# Patient Record
Sex: Male | Born: 1965 | Race: Black or African American | Hispanic: No | Smoking: Current every day smoker
Health system: Southern US, Community
[De-identification: ages and names within clinical notes are randomized; demographics above are authoritative.]

## PROBLEM LIST (undated history)

## (undated) DIAGNOSIS — I10 Essential (primary) hypertension: Secondary | ICD-10-CM

## (undated) DIAGNOSIS — C61 Malignant neoplasm of prostate: Secondary | ICD-10-CM

## (undated) DIAGNOSIS — M1990 Unspecified osteoarthritis, unspecified site: Secondary | ICD-10-CM

## (undated) DIAGNOSIS — M5430 Sciatica, unspecified side: Secondary | ICD-10-CM

## (undated) DIAGNOSIS — K219 Gastro-esophageal reflux disease without esophagitis: Secondary | ICD-10-CM

## (undated) DIAGNOSIS — M109 Gout, unspecified: Secondary | ICD-10-CM

## (undated) DIAGNOSIS — E7800 Pure hypercholesterolemia, unspecified: Secondary | ICD-10-CM

## (undated) DIAGNOSIS — E669 Obesity, unspecified: Secondary | ICD-10-CM

## (undated) DIAGNOSIS — Z87898 Personal history of other specified conditions: Secondary | ICD-10-CM

## (undated) DIAGNOSIS — K635 Polyp of colon: Secondary | ICD-10-CM

## (undated) HISTORY — DX: Sciatica, unspecified side: M54.30

## (undated) HISTORY — DX: Polyp of colon: K63.5

## (undated) HISTORY — DX: Gout, unspecified: M10.9

## (undated) HISTORY — DX: Unspecified osteoarthritis, unspecified site: M19.90

## (undated) HISTORY — DX: Obesity, unspecified: E66.9

## (undated) HISTORY — PX: PROSTATE BIOPSY: SHX241

## (undated) HISTORY — DX: Personal history of other specified conditions: Z87.898

---

## 2001-05-25 ENCOUNTER — Emergency Department (HOSPITAL_COMMUNITY): Admission: EM | Admit: 2001-05-25 | Discharge: 2001-05-25 | Payer: Self-pay | Admitting: Emergency Medicine

## 2006-04-17 ENCOUNTER — Emergency Department (HOSPITAL_COMMUNITY): Admission: EM | Admit: 2006-04-17 | Discharge: 2006-04-17 | Payer: Self-pay | Admitting: Emergency Medicine

## 2014-10-19 ENCOUNTER — Other Ambulatory Visit (HOSPITAL_COMMUNITY): Payer: Self-pay | Admitting: Physician Assistant

## 2014-10-19 DIAGNOSIS — F17219 Nicotine dependence, cigarettes, with unspecified nicotine-induced disorders: Secondary | ICD-10-CM

## 2014-10-19 DIAGNOSIS — Z0000 Encounter for general adult medical examination without abnormal findings: Secondary | ICD-10-CM

## 2014-12-20 ENCOUNTER — Other Ambulatory Visit: Payer: Self-pay | Admitting: Urology

## 2014-12-20 ENCOUNTER — Ambulatory Visit (INDEPENDENT_AMBULATORY_CARE_PROVIDER_SITE_OTHER): Payer: 59 | Admitting: Urology

## 2014-12-20 DIAGNOSIS — N401 Enlarged prostate with lower urinary tract symptoms: Secondary | ICD-10-CM

## 2014-12-20 DIAGNOSIS — R972 Elevated prostate specific antigen [PSA]: Secondary | ICD-10-CM | POA: Diagnosis not present

## 2014-12-20 DIAGNOSIS — R351 Nocturia: Secondary | ICD-10-CM

## 2014-12-25 ENCOUNTER — Other Ambulatory Visit: Payer: Self-pay | Admitting: Urology

## 2014-12-25 DIAGNOSIS — R972 Elevated prostate specific antigen [PSA]: Secondary | ICD-10-CM

## 2015-01-10 ENCOUNTER — Ambulatory Visit (HOSPITAL_COMMUNITY)
Admission: RE | Admit: 2015-01-10 | Discharge: 2015-01-10 | Disposition: A | Discharge status: Home / Self Care | Payer: 59 | Source: Ambulatory Visit | Attending: Urology | Admitting: Urology

## 2015-01-10 DIAGNOSIS — R972 Elevated prostate specific antigen [PSA]: Secondary | ICD-10-CM | POA: Diagnosis present

## 2015-01-10 DIAGNOSIS — C61 Malignant neoplasm of prostate: Secondary | ICD-10-CM | POA: Insufficient documentation

## 2015-01-10 MED ORDER — LIDOCAINE HCL (PF) 2 % IJ SOLN
INTRAMUSCULAR | Status: AC
  Administered 2015-01-10: 10 mL
  Filled 2015-01-10: qty 10

## 2015-01-10 MED ORDER — LIDOCAINE HCL (PF) 2 % IJ SOLN
10.0000 mL | Freq: Once | INTRAMUSCULAR | Status: AC
Start: 2015-01-10 — End: 2015-01-10
  Administered 2015-01-10: 10 mL

## 2015-01-10 MED ORDER — GENTAMICIN SULFATE 40 MG/ML IJ SOLN
80.0000 mg | Freq: Once | INTRAMUSCULAR | Status: AC
  Administered 2015-01-10: 80 mg via INTRAMUSCULAR

## 2015-01-10 MED ORDER — GENTAMICIN SULFATE 40 MG/ML IJ SOLN
INTRAMUSCULAR | Status: AC
  Administered 2015-01-10: 80 mg via INTRAMUSCULAR
  Filled 2015-01-10: qty 2

## 2015-01-10 NOTE — Discharge Instructions (Signed)
Transrectal Ultrasound-Guided Biopsy °A transrectal ultrasound-guided biopsy is a procedure to take samples of tissue from your prostate. Ultrasound images are used to guide the procedure. It is usually done to check the prostate gland for cancer. °BEFORE THE PROCEDURE °· Do not eat or drink after midnight on the night before your procedure. °· Take medicines as your doctor tells you. °· Your doctor may have you stop taking some medicines 5-7 days before the procedure. °· You will be given an enema before your procedure. During an enema, a liquid is put into your butt (rectum) to clear out waste. °· You may have lab tests the day of your procedure. °· Make plans to have someone drive you home. °PROCEDURE °· You will be given medicine to help you relax before the procedure. An IV tube will be put into one of your veins. It will be used to give fluids and medicine. °· You will be given medicine to reduce the risk of infection (antibiotic). °· You will be placed on your side. °· A probe with gel will be put in your butt. This is used to take pictures of your prostate and the area around it. °· A medicine to numb the area is put into your prostate. °· A biopsy needle is then inserted and guided to your prostate. °· Samples of prostate tissue are taken. The needle is removed. °· The samples are sent to a lab to be checked. Results are usually back in 2-3 days. °AFTER THE PROCEDURE °· You will be taken to a room where you will be watched until you are doing okay. °· You may have some pain in the area around your butt. You will be given medicines for this. °· You may be able to go home the same day. Sometimes, an overnight stay in the hospital is needed. °  °This information is not intended to replace advice given to you by your health care provider. Make sure you discuss any questions you have with your health care provider. °  °Document Released: 02/12/2009 Document Revised: 03/01/2013 Document Reviewed:  10/13/2012 °Elsevier Interactive Patient Education ©2016 Elsevier Inc. ° °

## 2015-01-10 NOTE — Sedation Documentation (Signed)
Patent encouraged to follow up with PCP for blood pressure per Dr. Alyson Ingles. Pt refuses to go to the ED for blood pressure states he will see PCP.

## 2015-01-31 ENCOUNTER — Ambulatory Visit (INDEPENDENT_AMBULATORY_CARE_PROVIDER_SITE_OTHER): Payer: 59 | Admitting: Urology

## 2015-01-31 ENCOUNTER — Ambulatory Visit: Payer: 59 | Admitting: Urology

## 2015-01-31 DIAGNOSIS — C61 Malignant neoplasm of prostate: Secondary | ICD-10-CM

## 2015-01-31 DIAGNOSIS — R972 Elevated prostate specific antigen [PSA]: Secondary | ICD-10-CM | POA: Diagnosis not present

## 2015-05-02 ENCOUNTER — Ambulatory Visit: Payer: 59 | Admitting: Urology

## 2015-05-16 ENCOUNTER — Ambulatory Visit (INDEPENDENT_AMBULATORY_CARE_PROVIDER_SITE_OTHER): Payer: 59 | Admitting: Urology

## 2015-05-16 DIAGNOSIS — R351 Nocturia: Secondary | ICD-10-CM

## 2015-05-16 DIAGNOSIS — C61 Malignant neoplasm of prostate: Secondary | ICD-10-CM

## 2015-08-15 ENCOUNTER — Ambulatory Visit: Payer: 59 | Admitting: Urology

## 2015-12-26 ENCOUNTER — Ambulatory Visit (HOSPITAL_COMMUNITY)
Admission: RE | Admit: 2015-12-26 | Discharge: 2015-12-26 | Disposition: A | Discharge status: Home / Self Care | Payer: 59 | Source: Ambulatory Visit | Attending: Registered Nurse | Admitting: Registered Nurse

## 2015-12-26 ENCOUNTER — Other Ambulatory Visit (HOSPITAL_COMMUNITY): Payer: Self-pay | Admitting: Registered Nurse

## 2015-12-26 DIAGNOSIS — M5441 Lumbago with sciatica, right side: Secondary | ICD-10-CM

## 2016-02-06 ENCOUNTER — Ambulatory Visit: Payer: 59 | Admitting: Urology

## 2016-03-04 ENCOUNTER — Ambulatory Visit (INDEPENDENT_AMBULATORY_CARE_PROVIDER_SITE_OTHER): Payer: 59 | Admitting: Urology

## 2016-03-04 DIAGNOSIS — R351 Nocturia: Secondary | ICD-10-CM | POA: Diagnosis not present

## 2016-03-04 DIAGNOSIS — C61 Malignant neoplasm of prostate: Secondary | ICD-10-CM | POA: Diagnosis not present

## 2016-03-05 ENCOUNTER — Other Ambulatory Visit: Payer: Self-pay | Admitting: Urology

## 2016-03-05 DIAGNOSIS — C61 Malignant neoplasm of prostate: Secondary | ICD-10-CM

## 2016-03-12 ENCOUNTER — Ambulatory Visit (HOSPITAL_COMMUNITY): Payer: 59

## 2016-04-16 ENCOUNTER — Encounter: Payer: Self-pay | Admitting: Radiation Oncology

## 2016-04-28 ENCOUNTER — Ambulatory Visit: Payer: 59

## 2016-04-28 ENCOUNTER — Ambulatory Visit: Payer: 59 | Admitting: Radiation Oncology

## 2016-04-28 NOTE — Progress Notes (Signed)
GU Location of Tumor / Histology: prostatic adenocarcinoma  If Prostate Cancer, Gleason Score is (3+3=6) and PSA is (5.7-12/26/17). Prostate volume: 30 gram  Christopher Stanton presented signs/symptoms of: Erection issues, Nocturia  Biopsies of prostate (if applicable) revealed:    Past/Anticipated interventions by urology, if any: 2016 biopsy, surveillance, referral to Dr. Tammi Klippel  Past/Anticipated interventions by medical oncology, if any: no  Weight changes, if any: no  Bowel/Bladder complaints, if any: IPSS 7. Denies dysuria, hematuria, or leakage   Nausea/Vomiting, if any: no  Pain issues, if any:  no  SAFETY ISSUES:  Prior radiation? no  Pacemaker/ICD? no  Possible current pregnancy? no  Is the patient on methotrexate? no  Current Complaints / other details: 51 year old male. Single. Lives in [place].  Active Surveillance since 02/08/15.  HTN, Esophageal reflux, gout, arthritis, hypercholesterolemia.  Caffeine, tobacco, and alcohol use. Scheduled for a biopsy at [facility] on 05/14/16.

## 2016-04-30 ENCOUNTER — Other Ambulatory Visit (HOSPITAL_COMMUNITY): Payer: 59

## 2016-05-05 ENCOUNTER — Encounter: Payer: Self-pay | Admitting: Radiation Oncology

## 2016-05-05 ENCOUNTER — Encounter: Payer: Self-pay | Admitting: Medical Oncology

## 2016-05-05 ENCOUNTER — Ambulatory Visit
Admission: RE | Admit: 2016-05-05 | Discharge: 2016-05-05 | Disposition: A | Discharge status: Home / Self Care | Payer: 59 | Source: Ambulatory Visit | Attending: Radiation Oncology | Admitting: Radiation Oncology

## 2016-05-05 VITALS — BP 128/99 | HR 64 | Resp 18 | Ht 66.0 in | Wt 245.0 lb

## 2016-05-05 DIAGNOSIS — I10 Essential (primary) hypertension: Secondary | ICD-10-CM | POA: Insufficient documentation

## 2016-05-05 DIAGNOSIS — F17210 Nicotine dependence, cigarettes, uncomplicated: Secondary | ICD-10-CM | POA: Diagnosis not present

## 2016-05-05 DIAGNOSIS — C61 Malignant neoplasm of prostate: Secondary | ICD-10-CM

## 2016-05-05 DIAGNOSIS — M1990 Unspecified osteoarthritis, unspecified site: Secondary | ICD-10-CM | POA: Diagnosis not present

## 2016-05-05 DIAGNOSIS — K219 Gastro-esophageal reflux disease without esophagitis: Secondary | ICD-10-CM | POA: Insufficient documentation

## 2016-05-05 HISTORY — DX: Malignant neoplasm of prostate: C61

## 2016-05-05 HISTORY — DX: Unspecified osteoarthritis, unspecified site: M19.90

## 2016-05-05 HISTORY — DX: Gastro-esophageal reflux disease without esophagitis: K21.9

## 2016-05-05 HISTORY — DX: Pure hypercholesterolemia, unspecified: E78.00

## 2016-05-05 HISTORY — DX: Essential (primary) hypertension: I10

## 2016-05-05 NOTE — Progress Notes (Signed)
Radiation Oncology         [phone]) [phone] ________________________________  Initial outpatient Consultation  Name: Christopher Stanton MRN: [id number]  Date: 05/05/2016  DOB: 01-Oct-1965  [id number], PA-C  McKenzie, Candee Furbish, MD   REFERRING PHYSICIAN: Cleon Gustin, MD  DIAGNOSIS: 51 y.o. 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 Malignant neoplasm of prostate ([place]) [facility] Referral to [facility] ILLNESS:Christopher Stanton is a 51 y.o. gentleman.  He was noted to have an elevated PSA of 4.6 by his primary care physician.  Accordingly, he was referred for evaluation in urology by Dr. Alyson Ingles on 01/10/2015,  digital rectal examination was performed at that time revealing no nodules.  The patient proceeded to transrectal ultrasound with 12 biopsies of the prostate on 01/10/15.  The prostate volume measured 30 cc.  Out of 12 core biopsies,1 was positive.  The maximum Gleason score was 3+3, and this was seen in left apex lateral. The patient elected to remain under active surveillance since 02/08/15. His most recent PSA was 5.7 on 03/05/16. He is scheduled for a repeat surveillance biopsy at [facility] on 05/14/16.  He asked about getting radiation therapy either instead of or after biopsy.  Kindly, he has been referred for discussion of potential radiation treatment options.   PREVIOUS RADIATION THERAPY: No Past Medical History:  Past Medical History:  Diagnosis Date  . Arthritis   . GERD (gastroesophageal reflux disease)   . Hypercholesteremia   . Hypertension   . Prostate cancer [facility])     Past Surgical History: Past Surgical History:  Procedure Laterality Date  . PROSTATE BIOPSY      Social History:  Social History   Social History  . Marital status: Single    Spouse name: N/A  . Number of children: N/A  . Years of education: N/A    Occupational History  . Not on file.   Social History Main Topics  . Smoking status: Current Every Day Smoker    Packs/day: 1.50    Years: 17.00    Types: Cigarettes  . Smokeless tobacco: Never Used  . Alcohol use Yes     Comment: social drinker  . Drug use: No  . Sexual activity: Not on file   Other Topics Concern  . Not on file   Social History Narrative  . No narrative on file    Family History: Family History  Problem Relation Age of Onset  . Heart attack Mother   . Cancer Father 47    prostate  . Cancer Sister     breast    ALLERGIES: Peanut-containing drug products  MEDICATIONS:  Current Outpatient Prescriptions  Medication Sig Dispense Refill  . amLODipine (NORVASC) 10 MG tablet     . valsartan-hydrochlorothiazide (DIOVAN-HCT) 320-25 MG tablet      No current facility-administered medications for this encounter.     REVIEW OF SYSTEMS:  On review of systems, the patient reports that he is doing well overall. He denies any chest pain, shortness of breath, cough, fevers, chills, night sweats, unintended weight changes. He denies any bowel  disturbances, and denies abdominal pain, nausea or vomiting. He denies any new musculoskeletal or joint aches or pains, new skin lesions or concerns. The patient completed an IPSS and IIEF questionnaire.  His IPSS score was 7 indicating mild urinary outflow obstructive  symptoms.  He indicated that his erectile function is able to complete sexual activity with some attempts. A complete review of systems is obtained and is otherwise negative.   PHYSICAL EXAM: This patient is in no acute distress.  He is alert and oriented.   height is 5\' 6"  (1.676 m) and weight is 245 lb (111.1 kg). His blood pressure is 128/99 (abnormal) and his pulse is 64. His respiration is 18 and oxygen saturation is 98%.   In general this is a well appearing African American male in no acute distress. He is alert and oriented x4 and appropriate throughout  the examination. HEENT reveals that the patient is normocephalic, atraumatic. EOMs are intact. PERRLA. Skin is intact without any evidence of gross lesions. Cardiovascular exam reveals a regular rate and rhythm, no clicks rubs or murmurs are auscultated. Chest is clear to auscultation bilaterally. Lymphatic assessment is performed and does not reveal any adenopathy in the cervical, supraclavicular, axillary, or inguinal chains. Abdomen has active bowel sounds in all quadrants and is intact. The abdomen is soft, non tender, non distended. Lower extremities are negative for pretibial pitting edema, deep calf tenderness, cyanosis or clubbing.   KPS =100  100 - Normal; no complaints; no evidence of disease. 90   - Able to carry on normal activity; minor signs or symptoms of disease. 80   - Normal activity with effort; some signs or symptoms of disease. 46   - Cares for self; unable to carry on normal activity or to do active work. 60   - Requires occasional assistance, but is able to care for most of his personal needs. 50   - Requires considerable assistance and frequent medical care. 27   - Disabled; requires special care and assistance. 67   - Severely disabled; hospital admission is indicated although death not imminent. 50   - Very sick; hospital admission necessary; active supportive treatment necessary. 10   - Moribund; fatal processes progressing rapidly. 0     - Dead  Karnofsky DA, Abelmann WH, Craver LS and Burchenal JH [phone]) The use of the nitrogen mustards in the palliative treatment of carcinoma: with particular reference to bronchogenic carcinoma Cancer 1 [phone]   LABORATORY DATA:  No results found for: WBC, HGB, HCT, MCV, PLT No results found for: NA, K, CL, CO2 No results found for: ALT, AST, GGT, ALKPHOS, BILITOT   RADIOGRAPHY: No results found.    IMPRESSION/PLAN:  1. 51 y.o. gentleman with low risk, T1c, adenocarcinoma of the prostate with a PSA of 5.11, and Gleason Score  of 3+3.  We reviewed his history and work up thus far. We reviewed the findings considering T-Stage, Gleason's Score, and PSA put him into the low risk group.  Accordingly he is eligible for a variety of potential treatment options active surveillance, including prostatectomy, external beam radiation, or radioactive seed implant.  We discussed the risks, benefits, short, and long term effects of radiotherapy. We discussed the delivery and logistics of radiotherapy. At the conclusion of the discussion, we discussed that since his previous biopsy was over 12 months ago, we would recommend proceeding with the repeat biopsy to rule out disease progression. He is in agreement, we also discussed there may be utility in obtaining an MRI at some point in the future if continued surveillance is considered to map out the prostate prior to biopsy as African American men may be at increased risk of anterior gland involvement. He is interested in proceeding with biopsy, followed by  discussion regarding options for either external radiotherapy, versus seed implant. I will follow up with the patient once he's had his biopsy performed. We also discussed the potential for re-sampling not identifying any cancer, however if he did have a negative biopsy, he could still have MRI guided biopsy for subsequent active surveillance.  2. Possible genetic predisposition to malignancy. We reviewed the options to refer him for genetic testind due to family history of prostate and breast cancer as well as personal history of prostate cancer. He is in agreement with this process.   The above documentation reflects our direct findings during this shared patient visit.    Carola Rhine, PAC  &   Tyler Pita, MD [place] Director and Director of Stereotactic Radiosurgery Direct Dial: [phone]  Fax: [phone] [email or web address]  Skype  LinkedIn     This document serves as a record of  services personally performed by Shona Simpson, PA-C and Tyler Pita, MD. It was created on their behalf by Bethann Humble, a trained medical scribe. The creation of this record is based on the scribe's personal observations and the provider's statements to them. This document has been checked and approved by the attending provider.

## 2016-05-05 NOTE — Progress Notes (Signed)
See progress note under physician encounter. 

## 2016-05-06 NOTE — Addendum Note (Signed)
Encounter addended by: Heywood Footman, RN on: 05/06/2016 10:28 AM<BR>    Actions taken: Visit Navigator Flowsheet section accepted

## 2016-05-06 NOTE — Progress Notes (Signed)
I met Mr. Christopher Stanton and introduced myself  as the nurse navigator for the prostate patients and discussed my role.  He states that he had a biopsy in 2016 and his urologist would like for him to have a repeat biopsy. He just wants to get treatment. After discussing treatment plans with Dr. Tammi Klippel he understands the importance of repeat biopsy for staging and treatment. I have him my business card and asked him to call me with any questions or concerns.

## 2016-05-14 ENCOUNTER — Ambulatory Visit (HOSPITAL_COMMUNITY): Admission: RE | Admit: 2016-05-14 | Payer: 59 | Source: Ambulatory Visit

## 2016-05-19 ENCOUNTER — Other Ambulatory Visit: Payer: Self-pay | Admitting: Radiation Oncology

## 2016-05-22 ENCOUNTER — Encounter: Payer: Self-pay | Admitting: Genetic Counselor

## 2016-05-22 ENCOUNTER — Telehealth: Payer: Self-pay | Admitting: Genetic Counselor

## 2016-05-22 ENCOUNTER — Telehealth: Payer: Self-pay | Admitting: *Deleted

## 2016-05-22 NOTE — Telephone Encounter (Signed)
Called patient to inform of genetics appt. On 07-07-16 - arrival time - 1:45 pm with Roma Kayser, spoke with patient and he is aware of this appt.

## 2016-05-22 NOTE — Telephone Encounter (Signed)
Genetic counseling appt scheduled w/Shirley from [facility] for the pt to see Roma Kayser on 4/30 at 9am. Will mail the pt a letter.

## 2016-05-30 ENCOUNTER — Other Ambulatory Visit: Payer: Self-pay | Admitting: Radiation Oncology

## 2016-06-02 ENCOUNTER — Telehealth: Payer: Self-pay | Admitting: Medical Oncology

## 2016-06-02 NOTE — Progress Notes (Signed)
I spoke with Mr. Christopher Stanton regarding prostate biopsy. He states that he has not had it done yet due to scheduling conflicts. He states that he is planning to get it done in there near future.

## 2016-06-03 ENCOUNTER — Telehealth: Payer: Self-pay | Admitting: Radiation Oncology

## 2016-06-03 NOTE — Telephone Encounter (Signed)
-----   Message from Hayden Pedro, [place] sent at 05/30/2016  8:20 AM EDT ----- Do you know if he's having another biopsy? He needs one before we consider xrt, and he was supposed to have one a week or two ago I thought ----- Message ----- From: Hayden Pedro, PA-C Sent: 05/05/2016   3:51 PM To: Hayden Pedro, PA-C Subject: 05/19/16                                       Check with pathology from prostate biopsy

## 2016-06-03 NOTE — Telephone Encounter (Signed)
Attempting to reach patient per Shona Simpson request to inquire about biopsy. Phoned patient but got no answer and voicemail hasn't been set up. Phoned family friend and sister, left message with both and requested they have patient contact this RN.

## 2016-06-25 ENCOUNTER — Other Ambulatory Visit: Payer: Self-pay | Admitting: Urology

## 2016-06-25 ENCOUNTER — Ambulatory Visit (HOSPITAL_COMMUNITY)
Admission: RE | Admit: 2016-06-25 | Discharge: 2016-06-25 | Disposition: A | Discharge status: Home / Self Care | Payer: 59 | Source: Ambulatory Visit | Attending: Urology | Admitting: Urology

## 2016-06-25 ENCOUNTER — Encounter (HOSPITAL_COMMUNITY): Payer: Self-pay

## 2016-06-25 DIAGNOSIS — C61 Malignant neoplasm of prostate: Secondary | ICD-10-CM | POA: Diagnosis present

## 2016-06-25 DIAGNOSIS — R972 Elevated prostate specific antigen [PSA]: Secondary | ICD-10-CM | POA: Diagnosis not present

## 2016-06-25 MED ORDER — GENTAMICIN SULFATE 40 MG/ML IJ SOLN
INTRAMUSCULAR | Status: AC
  Administered 2016-06-25: 80 mg via INTRAMUSCULAR
  Filled 2016-06-25: qty 2

## 2016-06-25 MED ORDER — LIDOCAINE HCL (PF) 2 % IJ SOLN
10.0000 mL | Freq: Once | INTRAMUSCULAR | Status: AC
  Administered 2016-06-25: 10 mL

## 2016-06-25 MED ORDER — GENTAMICIN SULFATE 40 MG/ML IJ SOLN
80.0000 mg | Freq: Once | INTRAMUSCULAR | Status: AC
  Administered 2016-06-25: 80 mg via INTRAMUSCULAR

## 2016-06-25 MED ORDER — LIDOCAINE HCL (PF) 2 % IJ SOLN
INTRAMUSCULAR | Status: AC
  Administered 2016-06-25: 10 mL
  Filled 2016-06-25: qty 10

## 2016-06-25 NOTE — Discharge Instructions (Signed)
Transrectal Ultrasound-Guided Biopsy °A transrectal ultrasound-guided biopsy is a procedure to take samples of tissue from your prostate. Ultrasound images are used to guide the procedure. It is usually done to check the prostate gland for cancer. °What happens before the procedure? °· Do not eat or drink after midnight on the night before your procedure. °· Take medicines as your doctor tells you. °· Your doctor may have you stop taking some medicines 5-7 days before the procedure. °· You will be given an enema before your procedure. During an enema, a liquid is put into your butt (rectum) to clear out waste. °· You may have lab tests the day of your procedure. °· Make plans to have someone drive you home. °What happens during the procedure? °· You will be given medicine to help you relax before the procedure. An IV tube will be put into one of your veins. It will be used to give fluids and medicine. °· You will be given medicine to reduce the risk of infection (antibiotic). °· You will be placed on your side. °· A probe with gel will be put in your butt. This is used to take pictures of your prostate and the area around it. °· A medicine to numb the area is put into your prostate. °· A biopsy needle is then inserted and guided to your prostate. °· Samples of prostate tissue are taken. The needle is removed. °· The samples are sent to a lab to be checked. Results are usually back in 2-3 days. °What happens after the procedure? °· You will be taken to a room where you will be watched until you are doing okay. °· You may have some pain in the area around your butt. You will be given medicines for this. °· You may be able to go home the same day. Sometimes, an overnight stay in the hospital is needed. °This information is not intended to replace advice given to you by your health care provider. Make sure you discuss any questions you have with your health care provider. °Document Released: 02/12/2009 Document Revised:  08/02/2015 Document Reviewed: 10/13/2012 °Elsevier Interactive Patient Education © 2017 Elsevier Inc. ° °

## 2016-07-01 ENCOUNTER — Telehealth: Payer: Self-pay | Admitting: Radiation Oncology

## 2016-07-01 NOTE — Telephone Encounter (Addendum)
I called [facility] to find out his most recent biopsy which appears to again show one core of 3+3 prostate cancer. I called to discuss this with him and he will follow up on 07/09/16 with Dr. Alyson Ingles to review plans for either continued active surveillance which we'd recommend with MRI guidance, or treatment. If he sought treatment he would consider radioactive seed implant. We will await their discussion, but also introduce him by phone to Romie Jumper who schedules these procedures.

## 2016-07-07 ENCOUNTER — Encounter: Payer: 59 | Admitting: Genetic Counselor

## 2016-07-07 ENCOUNTER — Other Ambulatory Visit: Payer: 59

## 2016-07-09 ENCOUNTER — Institutional Professional Consult (permissible substitution) (INDEPENDENT_AMBULATORY_CARE_PROVIDER_SITE_OTHER): Payer: 59 | Admitting: Urology

## 2016-07-09 DIAGNOSIS — C61 Malignant neoplasm of prostate: Secondary | ICD-10-CM

## 2016-09-30 NOTE — Addendum Note (Signed)
Encounter addended by: Heywood Footman, RN on: 09/30/2016 12:15 PM<BR>    Actions taken: Visit Navigator Flowsheet section accepted

## 2017-03-18 ENCOUNTER — Ambulatory Visit: Payer: 59 | Admitting: Urology

## 2017-03-18 DIAGNOSIS — C61 Malignant neoplasm of prostate: Secondary | ICD-10-CM

## 2017-03-18 DIAGNOSIS — R351 Nocturia: Secondary | ICD-10-CM | POA: Diagnosis not present

## 2017-09-08 ENCOUNTER — Other Ambulatory Visit: Payer: Self-pay

## 2017-09-08 ENCOUNTER — Encounter (HOSPITAL_COMMUNITY): Payer: Self-pay | Admitting: Emergency Medicine

## 2017-09-08 ENCOUNTER — Emergency Department (HOSPITAL_COMMUNITY): Payer: No Typology Code available for payment source

## 2017-09-08 ENCOUNTER — Emergency Department (HOSPITAL_COMMUNITY)
Admission: EM | Admit: 2017-09-08 | Discharge: 2017-09-08 | Disposition: A | Discharge status: Home / Self Care | Payer: No Typology Code available for payment source | Attending: Emergency Medicine | Admitting: Emergency Medicine

## 2017-09-08 DIAGNOSIS — Z8546 Personal history of malignant neoplasm of prostate: Secondary | ICD-10-CM | POA: Insufficient documentation

## 2017-09-08 DIAGNOSIS — Z79899 Other long term (current) drug therapy: Secondary | ICD-10-CM | POA: Diagnosis not present

## 2017-09-08 DIAGNOSIS — Z91010 Allergy to peanuts: Secondary | ICD-10-CM | POA: Diagnosis not present

## 2017-09-08 DIAGNOSIS — M5431 Sciatica, right side: Secondary | ICD-10-CM | POA: Diagnosis not present

## 2017-09-08 DIAGNOSIS — I10 Essential (primary) hypertension: Secondary | ICD-10-CM | POA: Insufficient documentation

## 2017-09-08 DIAGNOSIS — F17210 Nicotine dependence, cigarettes, uncomplicated: Secondary | ICD-10-CM | POA: Diagnosis not present

## 2017-09-08 DIAGNOSIS — M545 Low back pain: Secondary | ICD-10-CM | POA: Diagnosis present

## 2017-09-08 MED ORDER — METHOCARBAMOL 500 MG PO TABS: 500.0000 mg | ORAL_TABLET | Freq: Two times a day (BID) | ORAL | 0 refills | Status: DC

## 2017-09-08 MED ORDER — METHOCARBAMOL 500 MG PO TABS
500.0000 mg | ORAL_TABLET | Freq: Once | ORAL | Status: AC
  Administered 2017-09-08: 500 mg via ORAL
  Filled 2017-09-08: qty 1

## 2017-09-08 MED ORDER — KETOROLAC TROMETHAMINE 60 MG/2ML IM SOLN
60.0000 mg | Freq: Once | INTRAMUSCULAR | Status: AC
  Administered 2017-09-08: 60 mg via INTRAMUSCULAR
  Filled 2017-09-08: qty 2

## 2017-09-08 MED ORDER — DICLOFENAC SODIUM 75 MG PO TBEC: 75.0000 mg | DELAYED_RELEASE_TABLET | Freq: Two times a day (BID) | ORAL | 0 refills | Status: DC

## 2017-09-08 NOTE — ED Provider Notes (Signed)
[facility] EMERGENCY DEPARTMENT Provider Note   CSN: [id number] Arrival date & time: 09/08/17  1757     History   Chief Complaint Chief Complaint  Patient presents with  . Back Pain    HPI Christopher Stanton is a 52 y.o. male.  The history is provided by the patient. No language interpreter was used.  Back Pain   This is a new problem. The current episode started yesterday. The problem occurs constantly. The problem has been gradually worsening. The pain is associated with no known injury. The pain is present in the lumbar spine. The pain is moderate. The symptoms are aggravated by twisting. The pain is worse during the day. He has tried nothing for the symptoms. The treatment provided no relief.  Pt complains of pain in low back that goes down his right leg.  Pt reports he has had pain in his back before but pain in his leg is new.    Past Medical History:  Diagnosis Date  . Arthritis   . GERD (gastroesophageal reflux disease)   . Hypercholesteremia   . Hypertension   . Prostate cancer [facility])     Patient Active Problem List   Diagnosis Date Noted  . Malignant neoplasm of prostate ([place]) 05/05/2016    Past Surgical History:  Procedure Laterality Date  . PROSTATE BIOPSY          Home Medications    Prior to Admission medications   Medication Sig Start Date End Date Taking? Authorizing Provider  amLODipine (NORVASC) 10 MG tablet  02/24/16   [provider]  diclofenac (VOLTAREN) 75 MG EC tablet Take 1 tablet (75 mg total) by mouth 2 (two) times daily. 09/08/17   Fransico Meadow, PA-C  methocarbamol (ROBAXIN) 500 MG tablet Take 1 tablet (500 mg total) by mouth 2 (two) times daily. 09/08/17   Fransico Meadow, PA-C  valsartan-hydrochlorothiazide (DIOVAN-HCT) 320-25 MG tablet  02/24/16   [provider]    Family History Family History  Problem Relation Age of Onset  . Heart attack Mother   . Cancer Father 47       prostate  . Cancer Sister    breast    Social History Social History   Tobacco Use  . Smoking status: Current Every Day Smoker    Packs/day: 1.50    Years: 17.00    Pack years: 25.50    Types: Cigarettes  . Smokeless tobacco: Never Used  Substance Use Topics  . Alcohol use: Yes    Comment: social drinker  . Drug use: No     Allergies   Peanut-containing drug products   Review of Systems Review of Systems  Musculoskeletal: Positive for back pain.  All other systems reviewed and are negative.    Physical Exam Updated Vital Signs BP (!) 159/98 (BP Location: Right Arm)   Pulse (!) 57   Temp 98.2 F (36.8 C) (Oral)   Resp 14   Ht 5\' 6"  (1.676 m)   Wt 102.1 kg (225 lb)   SpO2 97%   BMI 36.32 kg/m   Physical Exam  Constitutional: He is oriented to person, place, and time. He appears well-developed and well-nourished.  HENT:  Head: Normocephalic.  Eyes: EOM are normal.  Neck: Normal range of motion.  Cardiovascular: Normal rate and regular rhythm.  Pulmonary/Chest: Effort normal.  Abdominal: Soft. He exhibits no distension.  Musculoskeletal: Normal range of motion.  Negative homan's  Pain in sciatic notch.   Neurological: He  is alert and oriented to person, place, and time.  Skin: Skin is warm.  Psychiatric: He has a normal mood and affect.  Nursing note and vitals reviewed.    ED Treatments / Results  Labs (all labs ordered are listed, but only abnormal results are displayed) Labs Reviewed - No data to display  EKG None  Radiology Dg Lumbar Spine Complete  Result Date: 09/08/2017 CLINICAL DATA:  Lumbago with right-sided radicular symptoms. History of prostate carcinoma EXAM: LUMBAR SPINE - COMPLETE 4+ VIEW COMPARISON:  December 26, 2015 FINDINGS: Frontal, lateral, spot lumbosacral lateral, and bilateral oblique views were obtained. There are 5 non-rib-bearing lumbar type vertebral bodies. There is no fracture or spondylolisthesis. The disc spaces appear normal. There is no  appreciable facet arthropathy. IMPRESSION: No fracture or spondylolisthesis.  No appreciable arthropathy. Electronically Signed   By: Lowella Grip III M.D.   On: 09/08/2017 19:21    Procedures Procedures (including critical care time)  Medications Ordered in ED Medications  ketorolac (TORADOL) injection 60 mg (60 mg Intramuscular Given 09/08/17 1855)  methocarbamol (ROBAXIN) tablet 500 mg (500 mg Oral Given 09/08/17 1854)     Initial Impression / Assessment and Plan / ED Course  I have reviewed the triage vital signs and the nursing notes.  Pertinent labs & imaging results that were available during my care of the patient were reviewed by me and considered in my medical decision making (see chart for details).     MDM  Pt counseled on xrays, need for follow up.  Pt advised to see Dr. Aline Brochure for evaluation.  RX for voltaren and robaxin    Final Clinical Impressions(s) / ED Diagnoses   Final diagnoses:  Sciatica of right side    ED Discharge Orders        Ordered    diclofenac (VOLTAREN) 75 MG EC tablet  2 times daily     09/08/17 1953    methocarbamol (ROBAXIN) 500 MG tablet  2 times daily     09/08/17 1953       Sidney Ace 09/08/17 2005    Mesner, Corene Cornea, MD 09/09/17 2352

## 2017-09-08 NOTE — ED Triage Notes (Addendum)
Patient complaining of lower back pain radiating down right leg since yesterday. Denies injury. Denies dysuria.

## 2017-09-08 NOTE — Discharge Instructions (Signed)
Return if any problems.  See Dr. Aline Brochure for recheck if pain persist

## 2017-09-09 ENCOUNTER — Other Ambulatory Visit: Payer: Self-pay | Admitting: Urology

## 2017-09-09 DIAGNOSIS — C61 Malignant neoplasm of prostate: Secondary | ICD-10-CM

## 2017-09-16 ENCOUNTER — Other Ambulatory Visit: Payer: Self-pay | Admitting: Urology

## 2017-09-18 ENCOUNTER — Ambulatory Visit
Admission: RE | Admit: 2017-09-18 | Discharge: 2017-09-18 | Disposition: A | Discharge status: Home / Self Care | Payer: No Typology Code available for payment source | Source: Ambulatory Visit | Attending: Urology | Admitting: Urology

## 2017-09-18 DIAGNOSIS — C61 Malignant neoplasm of prostate: Secondary | ICD-10-CM

## 2017-09-18 MED ORDER — GADOBENATE DIMEGLUMINE 529 MG/ML IV SOLN
10.0000 mL | Freq: Once | INTRAVENOUS | Status: AC | PRN
  Administered 2017-09-18: 10 mL via INTRAVENOUS

## 2017-09-24 ENCOUNTER — Other Ambulatory Visit: Payer: Self-pay | Admitting: Urology

## 2017-09-24 DIAGNOSIS — C61 Malignant neoplasm of prostate: Secondary | ICD-10-CM

## 2017-09-30 ENCOUNTER — Ambulatory Visit (INDEPENDENT_AMBULATORY_CARE_PROVIDER_SITE_OTHER): Payer: No Typology Code available for payment source | Admitting: Urology

## 2017-09-30 DIAGNOSIS — C61 Malignant neoplasm of prostate: Secondary | ICD-10-CM

## 2017-09-30 DIAGNOSIS — R351 Nocturia: Secondary | ICD-10-CM | POA: Diagnosis not present

## 2018-05-25 ENCOUNTER — Encounter: Payer: Self-pay | Admitting: *Deleted

## 2018-06-09 ENCOUNTER — Ambulatory Visit: Payer: No Typology Code available for payment source

## 2018-07-15 ENCOUNTER — Ambulatory Visit: Payer: No Typology Code available for payment source

## 2018-07-29 ENCOUNTER — Encounter: Payer: Self-pay | Admitting: Gastroenterology

## 2018-08-19 ENCOUNTER — Ambulatory Visit: Payer: Self-pay

## 2018-10-26 ENCOUNTER — Ambulatory Visit (INDEPENDENT_AMBULATORY_CARE_PROVIDER_SITE_OTHER): Payer: Self-pay | Admitting: *Deleted

## 2018-10-26 ENCOUNTER — Other Ambulatory Visit: Payer: Self-pay

## 2018-10-26 DIAGNOSIS — Z1211 Encounter for screening for malignant neoplasm of colon: Secondary | ICD-10-CM

## 2018-10-26 MED ORDER — NA SULFATE-K SULFATE-MG SULF 17.5-3.13-1.6 GM/177ML PO SOLN: 1.0000 | Freq: Once | ORAL | 0 refills | Status: AC

## 2018-10-26 NOTE — Patient Instructions (Addendum)
Christopher Stanton  Mar 16, 1965 MRN: [id number]     Procedure Date: 12/10/2018 Time to register: 12:00 pm Place to register: [facility] Procedure Time: 1:00 pm Scheduled provider: Dr. Oneida Alar    PREPARATION FOR COLONOSCOPY WITH SUPREP BOWEL PREP KIT  Note: Suprep Bowel Prep Kit is a split-dose (2day) regimen. Consumption of BOTH 6-ounce bottles is required for a complete prep.  Please notify us immediately if you are diabetic, take iron supplements, or if you are on Coumadin or any other blood thinners.                                                                                                                                                 1 DAY BEFORE PROCEDURE:  DATE:  12/09/2018  DAY: Thursday Continue clear liquids the entire day - NO SOLID FOOD.    At 6:00pm: Complete steps 1 through 4 below, using ONE (1) 6-ounce bottle, before going to bed. Step 1:  Pour ONE (1) 6-ounce bottle of SUPREP liquid into the mixing container.  Step 2:  Add cool drinking water to the 16 ounce line on the container and mix.  Note: Dilute the solution concentrate as directed prior to use. Step 3:  DRINK ALL the liquid in the container. Step 4:  You MUST drink an additional two (2) or more 16 ounce containers of water over the next one (1) hour.   Continue clear liquids.  DAY OF PROCEDURE:   DATE: 12/10/2018   DAY: Friday If you take medications for your heart, blood pressure, or breathing, you may take these medications.    5 hours before your procedure at :  8:00 am  Step 1:  Pour ONE (1) 6-ounce bottle of SUPREP liquid into the mixing container.  Step 2:  Add cool drinking water to the 16 ounce line on the container and mix.  Note: Dilute the solution concentrate as directed prior to use. Step 3:  DRINK ALL the liquid in the container. Step 4:  You MUST drink an additional two (2) or more 16 ounce containers of water over the next one (1) hour. You MUST complete the final  glass of water at least 3 hours before your colonoscopy. Nothing by mouth past 10:00 am  You may take your morning medications with sip of water unless we have instructed otherwise.    Please see below for Dietary Information.  CLEAR LIQUIDS INCLUDE:  Water Jello (NOT red in color)   Ice Popsicles (NOT red in color)   Tea (sugar ok, no milk/cream) Powdered fruit flavored drinks  Coffee (sugar ok, no milk/cream) Gatorade/ Lemonade/ Kool-Aid  (NOT red in color)   Juice: apple, white grape, white cranberry Soft drinks  Clear bullion, consomme, broth (fat free beef/chicken/vegetable)  Carbonated beverages (any kind)  Strained chicken noodle soup Hard  Candy   Remember: Clear liquids are liquids that will allow you to see your fingers on the other side of a clear glass. Be sure liquids are NOT red in color, and not cloudy, but CLEAR.  DO NOT EAT OR DRINK ANY OF THE FOLLOWING:  Dairy products of any kind   Cranberry juice Tomato juice / V8 juice   Grapefruit juice Orange juice     Red grape juice  Do not eat any solid foods, including such foods as: cereal, oatmeal, yogurt, fruits, vegetables, creamed soups, eggs, bread, crackers, pureed foods in a blender, etc.   HELPFUL HINTS FOR DRINKING PREP SOLUTION:   Make sure prep is extremely cold. Mix and refrigerate the the morning of the prep. You may also put in the freezer.   You may try mixing some Crystal Light or Country Time Lemonade if you prefer. Mix in small amounts; add more if necessary.  Try drinking through a straw  Rinse mouth with water or a mouthwash between glasses, to remove after-taste.  Try sipping on a cold beverage /ice/ popsicles between glasses of prep.  Place a piece of sugar-free hard candy in mouth between glasses.  If you become nauseated, try consuming smaller amounts, or stretch out the time between glasses. Stop for 30-60 minutes, then slowly start back drinking.     OTHER INSTRUCTIONS  You will need  a responsible adult at least 55 years of age to accompany you and drive you home. This person must remain in the waiting room during your procedure. The hospital will cancel your procedure if you do not have a responsible adult with you.   1. Wear loose fitting clothing that is easily removed. 2. Leave jewelry and other valuables at home.  3. Remove all body piercing jewelry and leave at home. 4. Total time from sign-in until discharge is approximately 2-3 hours. 5. You should go home directly after your procedure and rest. You can resume normal activities the day after your procedure. 6. The day of your procedure you should not:  Drive  Make legal decisions  Operate machinery  Drink alcohol  Return to work   You may call the office (Dept: [phone]) before 5:00pm, or page the doctor on call [phone]) after 5:00pm, for further instructions, if necessary.   Insurance Information YOU WILL NEED TO CHECK WITH YOUR INSURANCE COMPANY FOR THE BENEFITS OF COVERAGE YOU HAVE FOR THIS PROCEDURE.  UNFORTUNATELY, NOT ALL INSURANCE COMPANIES HAVE BENEFITS TO COVER ALL OR PART OF THESE TYPES OF PROCEDURES.  IT IS YOUR RESPONSIBILITY TO CHECK YOUR BENEFITS, HOWEVER, WE WILL BE GLAD TO ASSIST YOU WITH ANY CODES YOUR INSURANCE COMPANY MAY NEED.    PLEASE NOTE THAT MOST INSURANCE COMPANIES WILL NOT COVER A SCREENING COLONOSCOPY FOR PEOPLE UNDER THE AGE OF 55  IF YOU HAVE BCBS INSURANCE, YOU MAY HAVE BENEFITS FOR A SCREENING COLONOSCOPY BUT IF POLYPS ARE FOUND THE DIAGNOSIS WILL CHANGE AND THEN YOU MAY HAVE A DEDUCTIBLE THAT WILL NEED TO BE MET. SO PLEASE MAKE SURE YOU CHECK YOUR BENEFITS FOR A SCREENING COLONOSCOPY AS WELL AS A DIAGNOSTIC COLONOSCOPY.

## 2018-10-26 NOTE — Progress Notes (Addendum)
Gastroenterology Pre-Procedure Review  Request Date: 10/26/2018 Requesting Physician: Rowan Blase, PA-C @ [facility], No previous TCS  PATIENT REVIEW QUESTIONS: The patient responded to the following health history questions as indicated:    1. Diabetes Melitis: No 2. Joint replacements in the past 12 months: No 3. Major health problems in the past 3 months: No 4. Has an artificial valve or MVP: No 5. Has a defibrillator: No 6. Has been advised in past to take antibiotics in advance of a procedure like teeth cleaning: No 7. Family history of colon cancer: No  8. Alcohol Use: Yes, 3 margaritas on Friday 9. History of sleep apnea: No  10. History of coronary artery or other vascular stents placed within the last 12 months: No 11. History of any prior anesthesia complications: No    MEDICATIONS & ALLERGIES:    Patient reports the following regarding taking any blood thinners:   Plavix? No Aspirin? No Coumadin? No Brilinta? No Xarelto? No Eliquis? No Pradaxa? No Savaysa? No Effient? No  Patient confirms/reports the following medications:  Current Outpatient Medications  Medication Sig Dispense Refill  . amLODipine (NORVASC) 10 MG tablet daily.     . valsartan-hydrochlorothiazide (DIOVAN-HCT) 320-25 MG tablet daily.      No current facility-administered medications for this visit.     Patient confirms/reports the following allergies:  Allergies  Allergen Reactions  . Peanut-Containing Drug Products Hives    Adult onset allergy    No orders of the defined types were placed in this encounter.   AUTHORIZATION INFORMATION Primary Insurance: [employer],  ID #:[id number] Pre-Cert / Josem Kaufmann required: Yes per [facility], Approved from 12/10/2018 to 36/08/4401 Pre-Cert / Auth #: Ref #: [id number]  SCHEDULE INFORMATION: Procedure has been scheduled as follows:  Date: 12/10/2018, Time: 1:00 Location: [facility] with Dr. Oneida Alar  This Gastroenterology Pre-Precedure Review Form is being routed to the  following provider(s): Roseanne Kaufman, NP

## 2018-10-27 NOTE — Addendum Note (Signed)
Addended by: Metro Kung on: 10/27/2018 01:07 PM   Modules accepted: Orders, SmartSet

## 2018-10-27 NOTE — Progress Notes (Signed)
Appropriate.

## 2018-10-28 ENCOUNTER — Ambulatory Visit: Payer: Self-pay

## 2018-12-09 ENCOUNTER — Other Ambulatory Visit: Payer: Self-pay

## 2018-12-09 ENCOUNTER — Other Ambulatory Visit (HOSPITAL_COMMUNITY)
Admission: RE | Admit: 2018-12-09 | Discharge: 2018-12-09 | Disposition: A | Discharge status: Home / Self Care | Payer: No Typology Code available for payment source | Source: Ambulatory Visit | Attending: Gastroenterology | Admitting: Gastroenterology

## 2018-12-09 ENCOUNTER — Telehealth: Payer: Self-pay | Admitting: Gastroenterology

## 2018-12-09 DIAGNOSIS — Z20828 Contact with and (suspected) exposure to other viral communicable diseases: Secondary | ICD-10-CM | POA: Diagnosis present

## 2018-12-09 LAB — SARS CORONAVIRUS 2 (TAT 6-24 HRS): SARS Coronavirus 2: NEGATIVE

## 2018-12-09 NOTE — Telephone Encounter (Signed)
Spoke with pt about cheaper prep and informed him to pick up Dulcolax and fleet enema.  Pt changed his mind and decided to just go with the prep kit that we originally sent in for him (Suprep).  Pt advised to call me back if he has any further questions.   Pt voiced understanding.

## 2018-12-09 NOTE — Telephone Encounter (Signed)
Pt is asking for a cheaper prep. He uses [employer] and is scheduled for tomorrow.

## 2018-12-10 ENCOUNTER — Encounter (HOSPITAL_COMMUNITY)
Admission: RE | Disposition: A | Discharge status: Home / Self Care | Payer: Self-pay | Source: Home / Self Care | Attending: Gastroenterology

## 2018-12-10 ENCOUNTER — Ambulatory Visit (HOSPITAL_COMMUNITY)
Admission: RE | Admit: 2018-12-10 | Discharge: 2018-12-10 | Disposition: A | Discharge status: Home / Self Care | Payer: No Typology Code available for payment source | Attending: Gastroenterology | Admitting: Gastroenterology

## 2018-12-10 ENCOUNTER — Encounter (HOSPITAL_COMMUNITY): Payer: Self-pay | Admitting: *Deleted

## 2018-12-10 DIAGNOSIS — Z8546 Personal history of malignant neoplasm of prostate: Secondary | ICD-10-CM | POA: Diagnosis not present

## 2018-12-10 DIAGNOSIS — K644 Residual hemorrhoidal skin tags: Secondary | ICD-10-CM | POA: Diagnosis not present

## 2018-12-10 DIAGNOSIS — K219 Gastro-esophageal reflux disease without esophagitis: Secondary | ICD-10-CM | POA: Diagnosis not present

## 2018-12-10 DIAGNOSIS — D123 Benign neoplasm of transverse colon: Secondary | ICD-10-CM | POA: Diagnosis not present

## 2018-12-10 DIAGNOSIS — Z1211 Encounter for screening for malignant neoplasm of colon: Secondary | ICD-10-CM

## 2018-12-10 DIAGNOSIS — F17210 Nicotine dependence, cigarettes, uncomplicated: Secondary | ICD-10-CM | POA: Insufficient documentation

## 2018-12-10 DIAGNOSIS — K648 Other hemorrhoids: Secondary | ICD-10-CM | POA: Insufficient documentation

## 2018-12-10 DIAGNOSIS — Z803 Family history of malignant neoplasm of breast: Secondary | ICD-10-CM | POA: Diagnosis not present

## 2018-12-10 DIAGNOSIS — Z79899 Other long term (current) drug therapy: Secondary | ICD-10-CM | POA: Diagnosis not present

## 2018-12-10 DIAGNOSIS — E7800 Pure hypercholesterolemia, unspecified: Secondary | ICD-10-CM | POA: Diagnosis not present

## 2018-12-10 DIAGNOSIS — K635 Polyp of colon: Secondary | ICD-10-CM | POA: Diagnosis not present

## 2018-12-10 DIAGNOSIS — M1990 Unspecified osteoarthritis, unspecified site: Secondary | ICD-10-CM | POA: Insufficient documentation

## 2018-12-10 DIAGNOSIS — Z8249 Family history of ischemic heart disease and other diseases of the circulatory system: Secondary | ICD-10-CM | POA: Insufficient documentation

## 2018-12-10 DIAGNOSIS — Z8042 Family history of malignant neoplasm of prostate: Secondary | ICD-10-CM | POA: Insufficient documentation

## 2018-12-10 DIAGNOSIS — Q438 Other specified congenital malformations of intestine: Secondary | ICD-10-CM | POA: Insufficient documentation

## 2018-12-10 DIAGNOSIS — I10 Essential (primary) hypertension: Secondary | ICD-10-CM | POA: Insufficient documentation

## 2018-12-10 HISTORY — PX: COLONOSCOPY: SHX5424

## 2018-12-10 SURGERY — COLONOSCOPY
Anesthesia: Moderate Sedation

## 2018-12-10 MED ORDER — MIDAZOLAM HCL 5 MG/5ML IJ SOLN
INTRAMUSCULAR | Status: DC | PRN
  Administered 2018-12-10: 2 mg via INTRAVENOUS
  Administered 2018-12-10: 1 mg via INTRAVENOUS
  Administered 2018-12-10: 2 mg via INTRAVENOUS

## 2018-12-10 MED ORDER — MEPERIDINE HCL 100 MG/ML IJ SOLN
INTRAMUSCULAR | Status: DC | PRN
  Administered 2018-12-10: 25 mg
  Administered 2018-12-10: 50 mg
  Administered 2018-12-10: 25 mg

## 2018-12-10 MED ORDER — MEPERIDINE HCL 100 MG/ML IJ SOLN
INTRAMUSCULAR | Status: AC
  Filled 2018-12-10: qty 1

## 2018-12-10 MED ORDER — SODIUM CHLORIDE 0.9 % IV SOLN: INTRAVENOUS | Status: DC

## 2018-12-10 MED ORDER — MIDAZOLAM HCL 5 MG/5ML IJ SOLN
INTRAMUSCULAR | Status: AC
  Filled 2018-12-10: qty 5

## 2018-12-10 NOTE — H&P (Signed)
Primary Care Physician:  Scherrie Bateman Primary Gastroenterologist:  Dr. Oneida Alar  Pre-Procedure History & Physical: HPI:  Christopher Stanton is a 53 y.o. male here for COLON CANCER SCREENING.  Past Medical History:  Diagnosis Date  . Arthritis   . GERD (gastroesophageal reflux disease)   . Hypercholesteremia   . Hypertension   . Prostate cancer [facility])     Past Surgical History:  Procedure Laterality Date  . PROSTATE BIOPSY      Prior to Admission medications   Medication Sig Start Date End Date Taking? Authorizing Provider  amLODipine (NORVASC) 10 MG tablet Take 10 mg by mouth daily.  02/24/16  Yes [provider]  cetirizine (ZYRTEC) 10 MG tablet Take 10 mg by mouth daily as needed (SINUS ISSUES.).   Yes [provider]  fluticasone (FLONASE) 50 MCG/ACT nasal spray Place 1-2 sprays into both nostrils daily as needed (SINUS ISSUES.).   Yes [provider]  losartan (COZAAR) 100 MG tablet Take 100 mg by mouth daily. 06/23/18  Yes [provider]    Allergies as of 10/27/2018 - Review Complete 10/26/2018  Allergen Reaction Noted  . Peanut-containing drug products Hives 01/10/2015    Family History  Problem Relation Age of Onset  . Heart attack Mother   . Cancer Father 65       prostate  . Cancer Sister        breast  . Colon cancer Neg Hx   . Colon polyps Neg Hx     Social History   Socioeconomic History  . Marital status: Single    Spouse name: Not on file  . Number of children: Not on file  . Years of education: Not on file  . Highest education level: Not on file  Occupational History  . Not on file  Social Needs  . Financial resource strain: Not on file  . Food insecurity    Worry: Not on file    Inability: Not on file  . Transportation needs    Medical: Not on file    Non-medical: Not on file  Tobacco Use  . Smoking status: Current Every Day Smoker    Packs/day: 1.00    Years: 17.00    Pack years: 17.00     Types: Cigarettes  . Smokeless tobacco: Never Used  Substance and Sexual Activity  . Alcohol use: Yes    Comment: 3 margaritas on Friday   . Drug use: Yes    Types: Marijuana    Comment: occasionally  . Sexual activity: Not on file  Lifestyle  . Physical activity    Days per week: Not on file    Minutes per session: Not on file  . Stress: Not on file  Relationships  . Social Herbalist on phone: Not on file    Gets together: Not on file    Attends religious service: Not on file    Active member of club or organization: Not on file    Attends meetings of clubs or organizations: Not on file    Relationship status: Not on file  . Intimate partner violence    Fear of current or ex partner: Not on file    Emotionally abused: Not on file    Physically abused: Not on file    Forced sexual activity: Not on file  Other Topics Concern  . Not on file  Social History Narrative   WORKS AT [employer]. DIVORCED. 2 [id number], '92). SPENDS  FREE TIME: SIDE JOBS, 4 WHEELING., FISHES BUT PUTS THEM BACK.    Review of Systems: See HPI, otherwise negative ROS   Physical Exam: BP (!) 159/102   Pulse 88   Temp 98 F (36.7 C) (Oral)   Resp 17   Ht 5\' 6"  (1.676 m)   Wt 103 kg   SpO2 96%   BMI 36.64 kg/m  General:   Alert,  pleasant and cooperative in NAD Head:  Normocephalic and atraumatic. Neck:  Supple; Lungs:  Clear throughout to auscultation.    Heart:  Regular rate and rhythm. Abdomen:  Soft, nontender and nondistended. Normal bowel sounds, without guarding, and without rebound.   Neurologic:  Alert and  oriented x4;  grossly normal neurologically.  Impression/Plan:    SCREENING  Plan:  1. TCS TODAY DISCUSSED PROCEDURE, BENEFITS, & RISKS: < 1% chance of medication reaction, bleeding, perforation, ASPIRATION, or rupture of spleen/liver requiring surgery to fix it and missed polyps < 1 cm 10-20% of the time.

## 2018-12-10 NOTE — Discharge Instructions (Signed)
You had 2 polyps removed. You have SMALL internal hemorrhoids.   DRINK WATER TO KEEP YOUR URINE LIGHT YELLOW.  CONTINUE YOUR WEIGHT LOSS EFFORTS. YOUR BODY MASS INDEX IS OVER 30 WHICH MEANS YOU ARE OBESE. OBESITY IS ASSOCIATED WITH AN INCREASED FOR POLYPS, CIRRHOSIS, AND ALL CANCERS, INCLUDING ESOPHAGEAL AND COLON CANCER.   EAT TO LIVE AND THINK OF FOOD AS MEDICINE. 75% OF YOUR PLATE SHOULD BE FRUITS/VEGGIES.  To have more energy, and to lose weight:      1. CONTINUE YOUR WEIGHT LOSS EFFORTS. I RECOMMEND YOU READ AND FOLLOW RECOMMENDATIONS BY DR. MARK HYMAN, "10-DAY DETOX DIET".    2. If you must eat bread, EAT EZEKIEL BREAD. IT IS IN THE FROZEN SECTION OF THE GROCERY STORE.    3. DRINK WATER WITH FRUIT OR CUCUMBER ADDED. YOUR URINE SHOULD BE LIGHT YELLOW. AVOID SODA, GATORADE, ENERGY DRINKS, OR DIET SODA.     4. AVOID HIGH FRUCTOSE CORN SYRUP AND CAFFEINE.     5. DO NOT chew SUGAR FREE GUM OR USE ARTIFICIAL SWEETENERS. IF NEEDED USE STEVIA AS A SWEETENER.    6. DO NOT EAT ENRICHED WHEAT FLOUR, PASTA, RICE, OR CEREAL.    7. ONLY EAT WILD CAUGHT SEAFOOD, GRASS FED BEEF OR CHICKEN, PORK FROM PASTURE RAISE PIGS, OR EGGS FROM PASTURE RAISED CHICKENS.    8. START TAKING A MULTIVITAMIN, VITAMIN B12, AND VITAMIN D3 2000 IU DAILY.   ADDITIONAL SUPPLEMENTS TO DECREASE CRAVING AND SUPPRESS YOUR APPETITE:    1. CINNAMON 500 MG EVERY AM PRIOR TO FIRST MEAL.   2. CHROMIUM 400-500 MG WITH MEALS TWICE DAILY.   3. GREEN TEA EXTRACT ONE DAILY.   YOUR BIOPSY RESULTS WILL BE BACK IN 5 BUSINESS DAYS.  Next colonoscopy in 5-10 years.    Colonoscopy Care After Read the instructions outlined below and refer to this sheet in the next week. These discharge instructions provide you with general information on caring for yourself after you leave the hospital. While your treatment has been planned according to the most current medical practices available, unavoidable complications occasionally occur.  If you have any problems or questions after discharge, call DR. Con Arganbright, [phone].  ACTIVITY  You may resume your regular activity, but move at a slower pace for the next 24 hours.   Take frequent rest periods for the next 24 hours.   Walking will help get rid of the air and reduce the bloated feeling in your belly (abdomen).   No driving for 24 hours (because of the medicine (anesthesia) used during the test).   You may shower.   Do not sign any important legal documents or operate any machinery for 24 hours (because of the anesthesia used during the test).    NUTRITION  Drink plenty of fluids.   You may resume your normal diet as instructed by your doctor.   Begin with a light meal and progress to your normal diet. Heavy or fried foods are harder to digest and may make you feel sick to your stomach (nauseated).   Avoid alcoholic beverages for 24 hours or as instructed.    MEDICATIONS  You may resume your normal medications.   WHAT YOU CAN EXPECT TODAY  Some feelings of bloating in the abdomen.   Passage of more gas than usual.   Spotting of blood in your stool or on the toilet paper  .  IF YOU HAD POLYPS REMOVED DURING THE COLONOSCOPY:  Eat a soft diet IF YOU HAVE NAUSEA, BLOATING, ABDOMINAL  PAIN, OR VOMITING.    FINDING OUT THE RESULTS OF YOUR TEST Not all test results are available during your visit. DR. Oneida Alar WILL CALL YOU WITHIN 14 DAYS OF YOUR PROCEDUE WITH YOUR RESULTS. Do not assume everything is normal if you have not heard from DR. Herb Beltre, CALL HER OFFICE AT [phone].  SEEK IMMEDIATE MEDICAL ATTENTION AND CALL THE OFFICE: [phone] IF:  You have more than a spotting of blood in your stool.   Your belly is swollen (abdominal distention).   You are nauseated or vomiting.   You have a temperature over 101F.   You have abdominal pain or discomfort that is severe or gets worse throughout the day.   High-Fiber Diet A high-fiber diet  changes your normal diet to include more whole grains, legumes, fruits, and vegetables. Changes in the diet involve replacing refined carbohydrates with unrefined foods. The calorie level of the diet is essentially unchanged. The Dietary Reference Intake (recommended amount) for adult males is 38 grams per day. For adult females, it is 25 grams per day. Pregnant and lactating women should consume 28 grams of fiber per day. Fiber is the intact part of a plant that is not broken down during digestion. Functional fiber is fiber that has been isolated from the plant to provide a beneficial effect in the body.  PURPOSE  Increase stool bulk.   Ease and regulate bowel movements.   Lower cholesterol.   REDUCE RISK OF COLON CANCER  INDICATIONS THAT YOU NEED MORE FIBER  Constipation and hemorrhoids.   Uncomplicated diverticulosis (intestine condition) and irritable bowel syndrome.   Weight management.   As a protective measure against hardening of the arteries (atherosclerosis), diabetes, and cancer.   GUIDELINES FOR INCREASING FIBER IN THE DIET  Start adding fiber to the diet slowly. A gradual increase of about 5 more grams (2 servings of most fruits or vegetables) per day is best. Too rapid an increase in fiber may result in constipation, flatulence, and bloating.   Drink enough water and fluids to keep your urine clear or pale yellow. Water, juice, or caffeine-free drinks are recommended. Not drinking enough fluid may cause constipation.   Eat a variety of high-fiber foods rather than one type of fiber.   Try to increase your intake of fiber through using high-fiber foods rather than fiber pills or supplements that contain small amounts of fiber.   The goal is to change the types of food eaten. Do not supplement your present diet with high-fiber foods, but replace foods in your present diet.    Polyps, Colon  A polyp is extra tissue that grows inside your body. Colon polyps grow in the  large intestine. The large intestine, also called the colon, is part of your digestive system. It is a long, hollow tube at the end of your digestive tract where your body makes and stores stool. Most polyps are not dangerous. They are benign. This means they are not cancerous. But over time, some types of polyps can turn into cancer. Polyps that are smaller than a pea are usually not harmful. But larger polyps could someday become or may already be cancerous. To be safe, doctors remove all polyps and test them.   WHO GETS POLYPS? Anyone can get polyps, but certain people are more likely than others. You may have a greater chance of getting polyps if:  You are over 50.   You have had polyps before.   Someone in your family has had polyps.  Someone in your family has had cancer of the large intestine.   Find out if someone in your family has had polyps. You may also be more likely to get polyps if you:   Eat a lot of fatty foods   Smoke   Drink alcohol   Do not exercise  Eat too much   PREVENTION There is not one sure way to prevent polyps. You might be able to lower your risk of getting them if you:  Eat more fruits and vegetables and less fatty food.   Do not smoke.   Avoid alcohol.   Exercise every day.   Lose weight if you are overweight.   Eating more calcium and folate can also lower your risk of getting polyps. Some foods that are rich in calcium are milk, cheese, and broccoli. Some foods that are rich in folate are chickpeas, kidney beans, and spinach.

## 2018-12-10 NOTE — Op Note (Signed)
[facility] Patient Name: Christopher Stanton Procedure Date: 12/10/2018 12:32 PM MRN: [id number] Date of Birth: Aug 08, 1965 Attending MD: Barney Drain MD, MD CSN: [id number] Age: 53 Admit Type: Outpatient Procedure:                Colonoscopy WITH COLD SNARE POLYPECTOMY Indications:              Screening for colorectal malignant neoplasm Providers:                Barney Drain MD, MD, Janeece Riggers, RN, Aram Candela Referring MD:             Jake Samples PA Medicines:                Meperidine 100 mg IV, Midazolam 5 mg IV Complications:            No immediate complications. Estimated Blood Loss:     Estimated blood loss was minimal. Procedure:                Pre-Anesthesia Assessment:                           - Prior to the procedure, a History and Physical                            was performed, and patient medications and                            allergies were reviewed. The patient's tolerance of                            previous anesthesia was also reviewed. The risks                            and benefits of the procedure and the sedation                            options and risks were discussed with the patient.                            All questions were answered, and informed consent                            was obtained. Prior Anticoagulants: The patient has                            taken no previous anticoagulant or antiplatelet                            agents. ASA Grade Assessment: II - A patient with                            mild systemic disease. After reviewing the risks                            and benefits, the patient was deemed in  satisfactory condition to undergo the procedure.                            After obtaining informed consent, the colonoscope                            was passed under direct vision. Throughout the                            procedure, the patient's blood pressure, pulse, and                             oxygen saturations were monitored continuously. The                            [id number]-[id number]) scope was introduced through                            the anus and advanced to the the cecum, identified                            by appendiceal orifice and ileocecal valve. The                            colonoscopy was somewhat difficult due to a                            tortuous colon. Successful completion of the                            procedure was aided by straightening and shortening                            the scope to obtain bowel loop reduction and                            COLOWRAP. The patient tolerated the procedure well.                            The quality of the bowel preparation was excellent.                            The ileocecal valve, appendiceal orifice, and                            rectum were photographed. Scope In: 1:23:37 PM Scope Out: 1:38:25 PM Scope Withdrawal Time: 0 hours 12 minutes 39 seconds  Total Procedure Duration: 0 hours 14 minutes 48 seconds  Findings:      Two sessile polyps were found in the hepatic flexure. The polyps were 2       to 3 mm in size. These polyps were removed with a cold snare. Resection       and retrieval were complete.      External and internal hemorrhoids were found.  The recto-sigmoid colon and sigmoid colon were mildly tortuous. Impression:               - Two 2 to 3 mm polyps at the hepatic flexure,                            removed with a cold snare. Resected and retrieved.                           - External and internal hemorrhoids.                           - Tortuous colon. Moderate Sedation:      Moderate (conscious) sedation was administered by the endoscopy nurse       and supervised by the endoscopist. The following parameters were       monitored: oxygen saturation, heart rate, blood pressure, and response       to care. Total physician intraservice time was 27  minutes. Recommendation:           - Patient has a contact number available for                            emergencies. The signs and symptoms of potential                            delayed complications were discussed with the                            patient. Return to normal activities tomorrow.                            Written discharge instructions were provided to the                            patient.                           - High fiber diet.                           - Continue present medications.                           - Await pathology results.                           - Repeat colonoscopy in 5-10 years for surveillance. Procedure Code(s):        --- Professional ---                           [phone], Colonoscopy, flexible; with removal of                            tumor(s), polyp(s), or other lesion(s) by snare  technique                           [id number], Moderate sedation; each additional 15                            minutes intraservice time                           G0500, Moderate sedation services provided by the                            same physician or other qualified health care                            professional performing a gastrointestinal                            endoscopic service that sedation supports,                            requiring the presence of an independent trained                            observer to assist in the monitoring of the                            patient's level of consciousness and physiological                            status; initial 15 minutes of intra-service time;                            patient age 4 years or older (additional time may                            be reported with [id number], as appropriate) Diagnosis Code(s):        --- Professional ---                           Z12.11, Encounter for screening for malignant                            neoplasm of colon                            K63.5, Polyp of colon                           K64.8, Other hemorrhoids                           Q43.8, Other specified congenital malformations of                            intestine CPT copyright 2019 American Medical Association. All rights  reserved. The codes documented in this report are preliminary and upon coder review may  be revised to meet current compliance requirements. Barney Drain, MD Barney Drain MD, MD 12/10/2018 2:07:15 PM Number of Addenda: 0

## 2018-12-13 ENCOUNTER — Telehealth: Payer: Self-pay | Admitting: Gastroenterology

## 2018-12-13 LAB — SURGICAL PATHOLOGY

## 2018-12-13 NOTE — Telephone Encounter (Signed)
PLEASE CALL PT. HE HAD TWO SIMPLE ADENOMAS REMOVED.   DRINK WATER TO KEEP YOUR URINE LIGHT YELLOW. FOLLOW A HIGH FIBER DIET. AVOID ITEMS THAT CAUSE BLOATING & GAS.  Next colonoscopy in 5-10 years.

## 2018-12-13 NOTE — Telephone Encounter (Signed)
Called and mailbox is full and could not accept messages.

## 2018-12-14 NOTE — Telephone Encounter (Signed)
Reminder in epic °

## 2018-12-14 NOTE — Telephone Encounter (Signed)
Mailed letter to call for results.  

## 2018-12-17 ENCOUNTER — Encounter (HOSPITAL_COMMUNITY): Payer: Self-pay | Admitting: Gastroenterology

## 2018-12-20 NOTE — Telephone Encounter (Signed)
Pt called and was informed of results.  

## 2019-11-28 IMAGING — DX DG LUMBAR SPINE COMPLETE 4+V
5 series · 5 of 5 positions shown · non-contrast
Comparison: December 26, 2015

CLINICAL DATA: Lumbago with right-sided radicular symptoms. History
of prostate carcinoma

EXAM:
LUMBAR SPINE - COMPLETE 4+ VIEW

[l-spine ap]
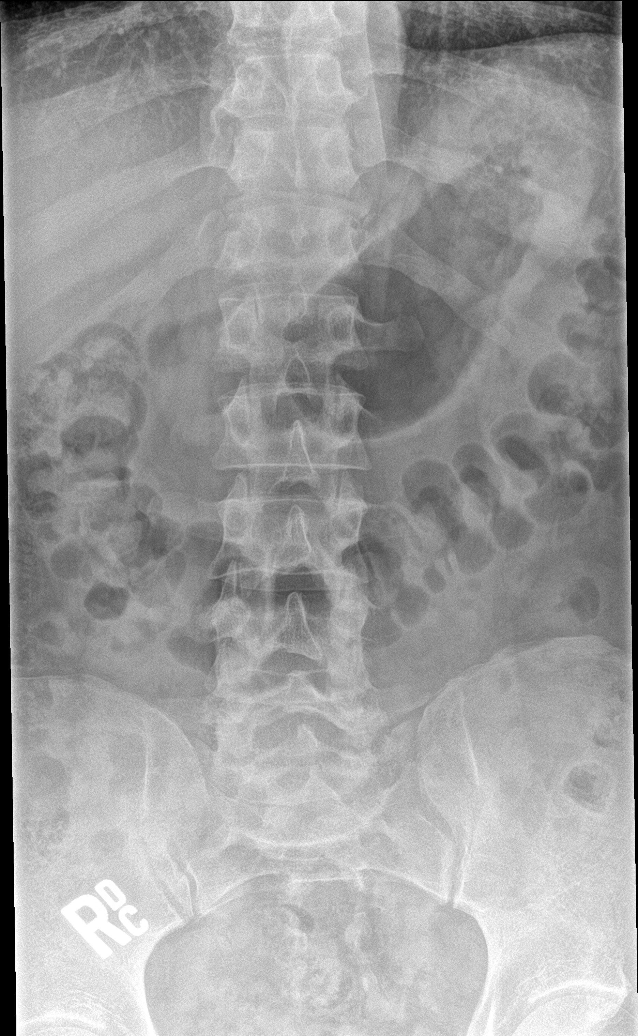

[l-spine obl (1 of 2)]
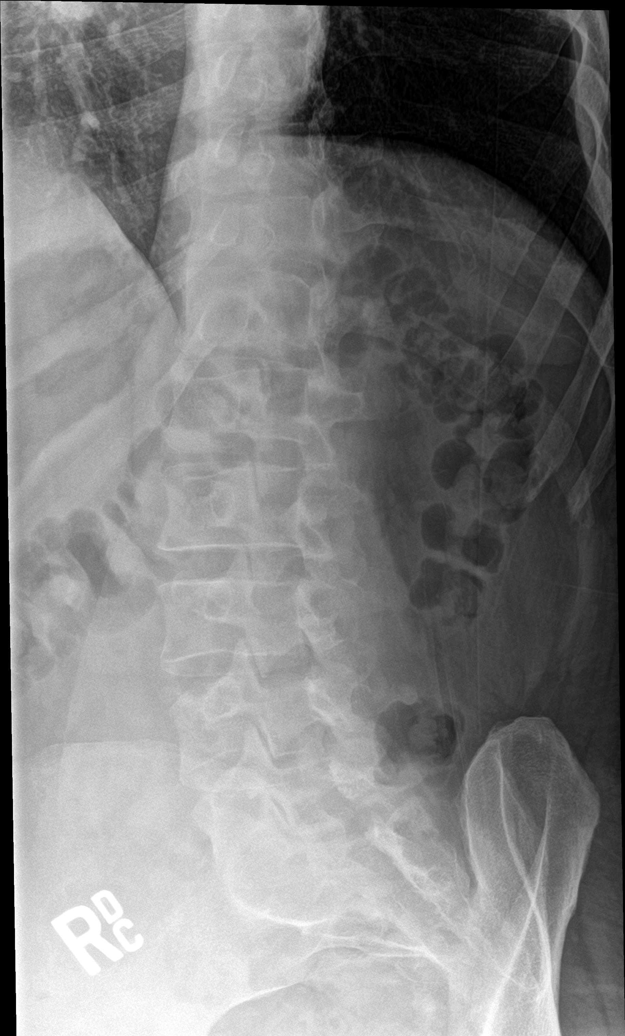

[l-spine obl (2 of 2)]
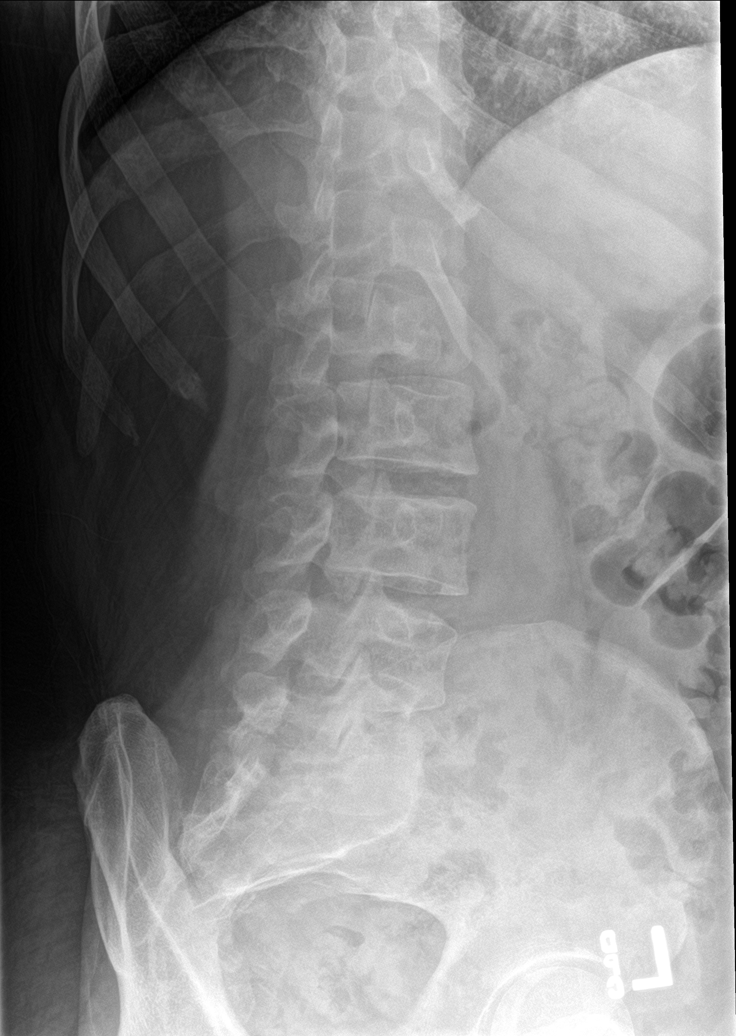

[l-spine lat]
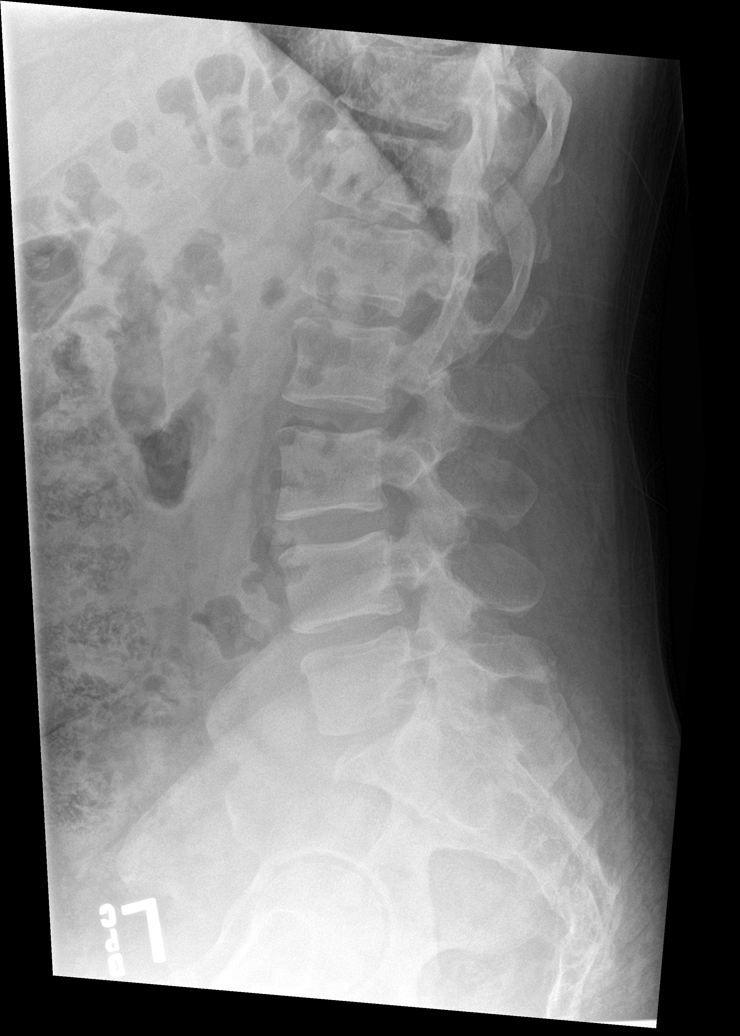

[l-spine spot]
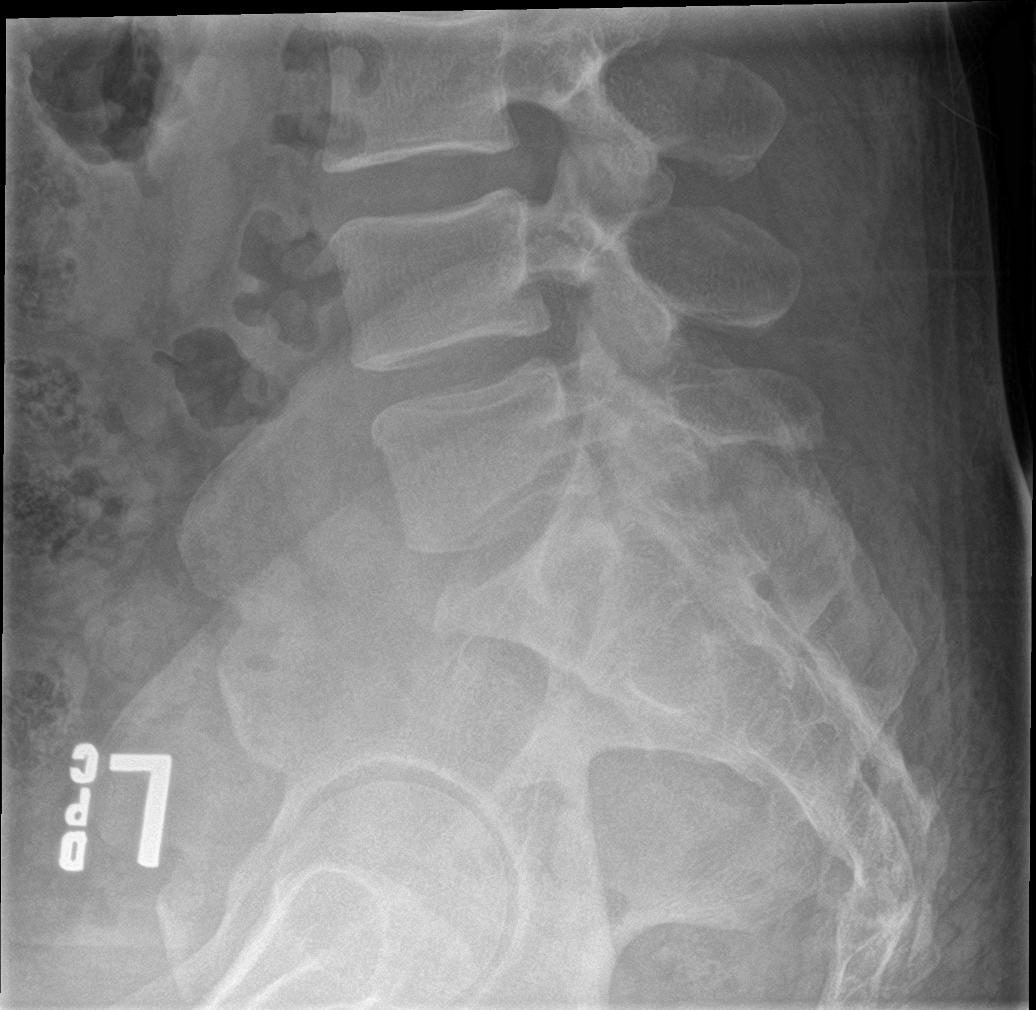

[5 of 5 positions shown; findings below may reference images not displayed]

FINDINGS: Frontal, lateral, spot lumbosacral lateral, and bilateral oblique
views were obtained. There are 5 non-rib-bearing lumbar type
vertebral bodies. There is no fracture or spondylolisthesis. The
disc spaces appear normal. There is no appreciable facet
arthropathy.
IMPRESSION: No fracture or spondylolisthesis.  No appreciable arthropathy.

## 2019-12-19 ENCOUNTER — Other Ambulatory Visit: Payer: Self-pay | Admitting: *Deleted

## 2019-12-19 ENCOUNTER — Other Ambulatory Visit: Payer: No Typology Code available for payment source

## 2019-12-19 DIAGNOSIS — Z20822 Contact with and (suspected) exposure to covid-19: Secondary | ICD-10-CM

## 2019-12-20 LAB — NOVEL CORONAVIRUS, NAA: SARS-CoV-2, NAA: NOT DETECTED

## 2019-12-20 LAB — SARS-COV-2, NAA 2 DAY TAT

## 2019-12-20 LAB — SPECIMEN STATUS REPORT

## 2020-03-16 ENCOUNTER — Other Ambulatory Visit: Payer: No Typology Code available for payment source

## 2020-10-03 ENCOUNTER — Other Ambulatory Visit: Payer: Self-pay | Admitting: Internal Medicine

## 2020-10-03 DIAGNOSIS — R590 Localized enlarged lymph nodes: Secondary | ICD-10-CM

## 2020-10-04 ENCOUNTER — Other Ambulatory Visit: Payer: Self-pay | Admitting: Internal Medicine

## 2020-10-04 DIAGNOSIS — R7989 Other specified abnormal findings of blood chemistry: Secondary | ICD-10-CM

## 2020-10-08 ENCOUNTER — Other Ambulatory Visit: Payer: No Typology Code available for payment source

## 2020-10-09 ENCOUNTER — Ambulatory Visit
Admission: RE | Admit: 2020-10-09 | Discharge: 2020-10-09 | Disposition: A | Discharge status: Home / Self Care | Payer: No Typology Code available for payment source | Source: Ambulatory Visit | Attending: Internal Medicine | Admitting: Internal Medicine

## 2020-10-09 DIAGNOSIS — R7989 Other specified abnormal findings of blood chemistry: Secondary | ICD-10-CM

## 2020-10-10 ENCOUNTER — Ambulatory Visit
Admission: RE | Admit: 2020-10-10 | Discharge: 2020-10-10 | Disposition: A | Discharge status: Home / Self Care | Payer: 59 | Source: Ambulatory Visit | Attending: Internal Medicine | Admitting: Internal Medicine

## 2020-10-10 DIAGNOSIS — R590 Localized enlarged lymph nodes: Secondary | ICD-10-CM

## 2020-10-10 MED ORDER — IOPAMIDOL (ISOVUE-300) INJECTION 61%
75.0000 mL | Freq: Once | INTRAVENOUS | Status: AC | PRN
  Administered 2020-10-10: 75 mL via INTRAVENOUS

## 2020-10-25 ENCOUNTER — Other Ambulatory Visit: Payer: No Typology Code available for payment source

## 2020-12-18 ENCOUNTER — Ambulatory Visit (INDEPENDENT_AMBULATORY_CARE_PROVIDER_SITE_OTHER): Payer: 59 | Admitting: Neurology

## 2020-12-18 ENCOUNTER — Encounter: Payer: Self-pay | Admitting: Neurology

## 2020-12-18 VITALS — BP 162/100 | HR 71 | Ht 66.0 in | Wt 248.0 lb

## 2020-12-18 DIAGNOSIS — H8113 Benign paroxysmal vertigo, bilateral: Secondary | ICD-10-CM | POA: Insufficient documentation

## 2020-12-18 DIAGNOSIS — R0683 Snoring: Secondary | ICD-10-CM | POA: Insufficient documentation

## 2020-12-18 DIAGNOSIS — G4726 Circadian rhythm sleep disorder, shift work type: Secondary | ICD-10-CM | POA: Diagnosis not present

## 2020-12-18 DIAGNOSIS — G4719 Other hypersomnia: Secondary | ICD-10-CM | POA: Diagnosis not present

## 2020-12-18 DIAGNOSIS — I10 Essential (primary) hypertension: Secondary | ICD-10-CM | POA: Diagnosis not present

## 2020-12-18 NOTE — Patient Instructions (Signed)
Sleep Apnea Sleep apnea affects breathing during sleep. It causes breathing to stop for 10 seconds or more, or to become shallow. People with sleep apnea usually snore loudly. It can also increase the risk of: Heart attack. Stroke. Being very overweight (obese). Diabetes. Heart failure. Irregular heartbeat. High blood pressure. The goal of treatment is to help you breathe normally again. What are the causes? The most common cause of this condition is a collapsed or blocked airway. There are three kinds of sleep apnea: Obstructive sleep apnea. This is caused by a blocked or collapsed airway. Central sleep apnea. This happens when the brain does not send the right signals to the muscles that control breathing. Mixed sleep apnea. This is a combination of obstructive and central sleep apnea. What increases the risk? Being overweight. Smoking. Having a small airway. Being older. Being male. Drinking alcohol. Taking medicines to calm yourself (sedatives or tranquilizers). Having family members with the condition. Having a tongue or tonsils that are larger than normal. What are the signs or symptoms? Trouble staying asleep. Loud snoring. Headaches in the morning. Waking up gasping. Dry mouth or sore throat in the morning. Being sleepy or tired during the day. If you are sleepy or tired during the day, you may also: Not be able to focus your mind (concentrate). Forget things. Get angry a lot and have mood swings. Feel sad (depressed). Have changes in your personality. Have less interest in sex, if you are male. Be unable to have an erection, if you are male. How is this treated?  Sleeping on your side. Using a medicine to get rid of mucus in your nose (decongestant). Avoiding the use of alcohol, medicines to help you relax, or certain pain medicines (narcotics). Losing weight, if needed. Changing your diet. Quitting smoking. Using a machine to open your airway while you  sleep, such as: An oral appliance. This is a mouthpiece that shifts your lower jaw forward. A CPAP device. This device blows air through a mask when you breathe out (exhale). An EPAP device. This has valves that you put in each nostril. A BPAP device. This device blows air through a mask when you breathe in (inhale) and breathe out. Having surgery if other treatments do not work. Follow these instructions at home: Lifestyle Make changes that your doctor recommends. Eat a healthy diet. Lose weight if needed. Avoid alcohol, medicines to help you relax, and some pain medicines. Do not smoke or use any products that contain nicotine or tobacco. If you need help quitting, ask your doctor. General instructions Take over-the-counter and prescription medicines only as told by your doctor. If you were given a machine to use while you sleep, use it only as told by your doctor. If you are having surgery, make sure to tell your doctor you have sleep apnea. You may need to bring your device with you. Keep all follow-up visits. Contact a doctor if: The machine that you were given to use during sleep bothers you or does not seem to be working. You do not get better. You get worse. Get help right away if: Your chest hurts. You have trouble breathing in enough air. You have an uncomfortable feeling in your back, arms, or stomach. You have trouble talking. One side of your body feels weak. A part of your face is hanging down. These symptoms may be an emergency. Get help right away. Call your local emergency services (911 in the [place] Do not wait to see if the symptoms  will go away. Do not drive yourself to the hospital. Summary This condition affects breathing during sleep. The most common cause is a collapsed or blocked airway. The goal of treatment is to help you breathe normally while you sleep. This information is not intended to replace advice given to you by your health care provider. Make  sure you discuss any questions you have with your health care provider. Document Revised: 02/03/2020 Document Reviewed: 02/03/2020 Elsevier Patient Education  2022 [place] Sleep Information, Adult Quality sleep is important for your mental and physical health. It also improves your quality of life. Quality sleep means you: Are asleep for most of the time you are in bed. Fall asleep within 30 minutes. Wake up no more than once a night.  Are awake for no longer than 20 minutes if you do wake up during the night. Most adults need 7-8 hours of quality sleep each night. How can poor sleep affect me? If you do not get enough quality sleep, you may have: Mood swings. Daytime sleepiness. Confusion. Decreased reaction time. Sleep disorders, such as insomnia and sleep apnea. Difficulty with: Solving problems. Coping with stress. Paying attention. These issues may affect your performance and productivity at work, school, and at home. Lack of sleep may also put you at higher risk for accidents, suicide, and risky behaviors. If you do not get quality sleep you may also be at higher risk for several health problems, including: Infections. Type 2 diabetes. Heart disease. High blood pressure. Obesity. Worsening of long-term conditions, like arthritis, kidney disease, depression, Parkinson's disease, and epilepsy. What actions can I take to get more quality sleep?   Stick to a sleep schedule. Go to sleep and wake up at about the same time each day. Do not try to sleep less on weekdays and make up for lost sleep on weekends. This does not work. Try to get about 30 minutes of exercise on most days. Do not exercise 2-3 hours before going to bed. Limit naps during the day to 30 minutes or less. Do not use any products that contain nicotine or tobacco, such as cigarettes or e-cigarettes. If you need help quitting, ask your health care provider. Do not drink caffeinated beverages for at  least 8 hours before going to bed. Coffee, tea, and some sodas contain caffeine. Do not drink alcohol close to bedtime. Do not eat large meals close to bedtime. Do not take naps in the late afternoon. Try to get at least 30 minutes of sunlight every day. Morning sunlight is best. Make time to relax before bed. Reading, listening to music, or taking a hot bath promotes quality sleep. Make your bedroom a place that promotes quality sleep. Keep your bedroom dark, quiet, and at a comfortable room temperature. Make sure your bed is comfortable. Take out sleep distractions like TV, a computer, smartphone, and bright lights. If you are lying awake in bed for longer than 20 minutes, get up and do a relaxing activity until you feel sleepy. Work with your health care provider to treat medical conditions that may affect sleeping, such as: Nasal obstruction. Snoring. Sleep apnea and other sleep disorders. Talk to your health care provider if you think any of your prescription medicines may cause you to have difficulty falling or staying asleep. If you have sleep problems, talk with a sleep consultant. If you think you have a sleep disorder, talk with your health care provider about getting evaluated by a specialist. Where to find more  information [employer] website: https://sleepfoundation.org National Heart, Lung, and [place] ([place]): http://www.saunders.info/.pdf Centers for Disease Control and Prevention (CDC): LearningDermatology.pl Contact a health care provider if you: Have trouble getting to sleep or staying asleep. Often wake up very early in the morning and cannot get back to sleep. Have daytime sleepiness. Have daytime sleep attacks of suddenly falling asleep and sudden muscle weakness (narcolepsy). Have a tingling sensation in your legs with a strong urge to move your legs (restless legs syndrome). Stop breathing briefly during sleep  (sleep apnea). Think you have a sleep disorder or are taking a medicine that is affecting your quality of sleep. Summary Most adults need 7-8 hours of quality sleep each night. Getting enough quality sleep is an important part of health and well-being. Make your bedroom a place that promotes quality sleep and avoid things that may cause you to have poor sleep, such as alcohol, caffeine, smoking, and large meals. Talk to your health care provider if you have trouble falling asleep or staying asleep. This information is not intended to replace advice given to you by your health care provider. Make sure you discuss any questions you have with your health care provider. Document Revised: 06/03/2017 Document Reviewed: 06/03/2017 Elsevier Patient Education  [place] [facility] and Sleep Apnea Sleep apnea is a condition in which breathing pauses or becomes shallow during sleep. Most people with the condition are not aware that they have it. Your health care providers need to know whether or not you have sleep apnea, especially if you are having surgery. Sleep apnea can increase your risk of complications during and after surgery. Tell a health care provider about: Any medical conditions you have, especially if you have sleep apnea. Any allergies you have. All medicines you are taking, including vitamins, herbs, eye drops, creams, and over-the-counter medicines. Any blood disorders you have. Any problems you or family members have had with anesthetic medicines. Any surgeries you have had. Whether you are pregnant or may be pregnant. What are the risks of having surgery? Untreated sleep apnea increases the risk for certain complications during and after surgery. This is because when you have sleep apnea, your airways are more sensitive to medicines used during surgery. The airways can collapse and block the flow of air.  Having untreated sleep apnea can increase your risk for: A longer  stay in the recovery room or hospital. Breathing difficulties such as low oxygen levels after surgery. Increased pain after surgery. Irregular heart rhythms. Stroke. Heart attack. You and your health care provider can take steps to help prevent these and other complications. What happens before the surgery? Sleep apnea screening Sleep apnea screening is a series of questions that determine if you are at risk for sleep apnea. Before you have surgery, get screened for sleep apnea and talk with your surgeon and primary health care provider about your results. Screening usually involves answering questions about your sleep quality. Ask your health care provider if you can be screened, or take a screening test yourself. You can find these tests online at the American Sleep Apnea Association website. Some questions you may be asked include: Do you snore? Is your sleep restless? Do you have daytime sleepiness? Has a partner or spouse told you that you stop breathing during sleep? Have you had trouble concentrating or memory loss? Answer these questions honestly. If a screening test is positive, this means you are at risk for the condition. Further testing may be needed to  confirm a diagnosis of sleep apnea. General instructions Talk to your health care provider about your individual risks based on your screening results and the type of surgery you will be having. If you have a sleep apnea device (positive airway pressure device), wear it as prescribed. If you have not been wearing your device, talk with your health care provider about why you have not been wearing it. There are ways to improve your use of the device, such as: Adjusting the mask. Adding humidified air. Getting treatment for nasal congestion. Do not use any products that contain nicotine or tobacco. These products include cigarettes, chewing tobacco, and vaping devices, such as e-cigarettes. If you need help quitting, ask your health  care provider. Do not drink alcohol the day before or after your surgery because it can worsen your sleep apnea. What happens on the day of surgery? If told by your health care provider, bring your sleep apnea device with you. Wear your sleep apnea device when you are sleeping during your hospital stay, or as told by your health care provider. Ask your health care provider what special considerations will be taken during and after your surgery. What can I expect after the surgery? You may need to be given extra oxygen and wear a continuous oxygen monitor (pulse oximetry). For your safety, you may need to stay in the recovery room or hospital for longer than usual. Follow these instructions at home: Medicines Avoid using sleep medicines unless they are prescribed by a health care provider who is aware of the results of your sleep apnea screening. Avoid using sleep medicines while taking opioid pain medicine. Limit your use of opioid pain medicines as much as possible. Ask your health care provider what is a safe amount to use. Ask about using pain medicines that do not affect your breathing, such as NSAIDs or acetaminophen. General instructions  If your health care provider approves, raise the head of your bed or lie on your side. Do not lie flat on your back. Follow instructions from your health care provider about wearing your sleep device: Anytime you are sleeping, including during daytime naps. While taking prescription pain medicines, sleeping medicines, or medicines that make you drowsy. If you will be going home right after the procedure, plan to have a responsible adult care for you for the time you are told. This is important. Where to find more information For more information about sleep apnea screening and healthy sleep, visit these websites: Centers for Disease Control and Prevention: http://www.wolf.info/ American Sleep Apnea Association: www.sleepapnea.org Contact a health care provider  if: You have sleep apnea or think you may be at risk for sleep apnea, and you are scheduled for surgery. Get help right away if: You have trouble breathing. You are very drowsy and cannot stay awake. You are told that you have pauses in your breathing during sleep after surgery. You have chest pain. You have a fast heartbeat. These symptoms may represent a serious problem that is an emergency. Do not wait to see if the symptoms will go away. Get medical help right away. Call your local emergency services (911 in the [place] Do not drive yourself to the hospital. Summary Your health care providers need to know whether or not you have sleep apnea, especially if you are having surgery. If you have sleep apnea, you are at an increased risk for complications during surgery. You and your health care provider can take precautions to help prevent complications. If you have  sleep apnea, make sure to tell your health care provider and anesthesia specialist. This information is not intended to replace advice given to you by your health care provider. Make sure you discuss any questions you have with your health care provider. Document Revised: 02/03/2020 Document Reviewed: 02/03/2020 Elsevier Patient Education  2022 [employer].

## 2020-12-18 NOTE — Progress Notes (Signed)
SLEEP MEDICINE CLINIC    Provider:  Larey Seat, MD  Primary Care Physician:  Shon Baton, MD [place] [address]     Referring Provider: Shon Baton, [place] [address],  [place] [address]          Chief Complaint according to patient   Patient presents with:     New Patient (Initial Visit)     Rm 11, alone. Paper referral for OSA. No prior SS and no CPAP. Pt has been struggling w sinus problem for over 4 years and felt like something else was wrong and causing sleeping problems. A big polyp was found in his nose that will be removed on Oct 18.       HISTORY OF PRESENT ILLNESS:  Christopher Stanton is a 55 y.o. year old African American male and is seen on 12/18/2020 from PCP for a sleep consultation.  He was just diagnosed with a nasal or sinus polyp and reports a surgery is planned for the 18th October. He presents with high BP.  Chief concern according to patient :  " I am often congested, I was told I snore very loudly and I am breathing through my mouth, its gets so dry it wakes me up. "  Christopher Stanton  has a past medical history of Arthritis, Colon polyp, GERD (gastroesophageal reflux disease), Gout, History of syncope, Hypercholesteremia, Hypertension, Obesity, Osteoarthritis, Prostate cancer ([place]), he had rising PSA levels, and has been waiting to see Urologist- and Sciatica.     Sleep relevant medical history: Nocturia 3 or more ,no Tonsillectomy, sinus polyp.  IMPRESSION: 1. No enlarged lymph nodes in the neck. In particular, no enlarged lymph nodes posterior to the ear bilaterally. 2. 17 mm lesion in the left parotid tail. This may be inflamed parotid tissue. Correlate with physical exam and symptoms. 3. 25 mm polyp in the posterior nasopharynx. Recommend direct visualization.     Electronically Signed   By: Franchot Gallo M.D.   On: 10/11/2020 16:21     Family medical /sleep history: one of his 5 sisters with OSA.     Social history:  Patient is working for [employer]  and lives in a household with alone. Family status is single ,2 daughters.  Pets are not present. Tobacco use- 1 ppd.   ETOH use , Caffeine intake d/c 14 days ago-in form of Coffee( /) Soda( /) Tea ( /) or energy drinks. Regular exercise.        Sleep habits are as follows: The patient's dinner time is between 5-7 PM. The patient goes to bed at 10 PM and continues to sleep for 6 hours, wakes for 3-4 bathroom breaks, the first time at 1 AM.   The preferred sleep position is right side , with the support of 2 pillows.  Bedroom is dark, cool, quiet. He needs background TV on, he reports.  Dreams are reportedly rare.   5 AM is the usual rise time. The patient wakes up spontaneously 4.15 AM.  He reports not feeling refreshed or restored in AM, with symptoms such as dry mouth, morning headaches, and residual fatigue.  Naps are taken frequently, whenever not physically active and not stimulated, lasting from 45 to 60 minutes and are more refreshing than nocturnal sleep.    Review of Systems: Out of a complete 14 system review, the patient complains of only the following symptoms, and all other reviewed systems are negative.:  Fatigue, sleepiness ,  snoring, fragmented sleep, NOCTURIA< MOUTH BREATHING.  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    How likely are you to doze in the following situations: 0 = not likely, 1 = slight chance, 2 = moderate chance, 3 = high chance   Sitting and Reading? Watching Television? Sitting inactive in a public place (theater or meeting)? As a passenger in a car for an hour without a break? Lying down in the afternoon when circumstances permit? Sitting and talking to someone? Sitting quietly after lunch without alcohol? In a car, while stopped for a few minutes in traffic?   Total = 15/ 24 points   FSS endorsed at 21/ 63 points.   GDS; 2/ 15 - not depressed, but recently  laid-off.  Social History   Socioeconomic History   Marital status: Divorced    Spouse name: Not on file   Number of children: 2   Years of education: Not on file   Highest education level: High school graduate  Occupational History   Not on file  Tobacco Use   Smoking status: Every Day    Packs/day: 1.00    Years: 27.00    Pack years: 27.00    Types: Cigarettes   Smokeless tobacco: Never  Substance and Sexual Activity   Alcohol use: Yes    Comment: 3 margaritas on Friday    Drug use: Yes    Types: Marijuana    Comment: occasionally   Sexual activity: Not on file  Other Topics Concern   Not on file  Social History Narrative   WORKS AT [employer]. DIVORCED. 2 VQMG('86, '92). SPENDS FREE TIME: SIDE JOBS, 4 WHEELING., FISHES BUT PUTS THEM BACK.   Lives alone   Right handed   Caffeine: none   Social Determinants of Health   Financial Resource Strain: Not on file  Food Insecurity: Not on file  Transportation Needs: Not on file  Physical Activity: Not on file  Stress: Not on file  Social Connections: Not on file    Family History  Problem Relation Age of Onset   Heart attack Mother    Cancer Father 28       prostate   Cancer Sister        breast   Colon cancer Neg Hx    Colon polyps Neg Hx     Past Medical History:  Diagnosis Date   Arthritis    Colon polyp    GERD (gastroesophageal reflux disease)    Gout    History of syncope    Hypercholesteremia    Hypertension    Obesity    Osteoarthritis    Prostate cancer ([place])    Sciatica     Past Surgical History:  Procedure Laterality Date   COLONOSCOPY N/A 12/10/2018   Procedure: COLONOSCOPY;  Surgeon: Danie Binder, MD;  Location: [facility];  Service: Endoscopy;  Laterality: N/A;  1:00   PROSTATE BIOPSY       Current Outpatient Medications on File Prior to Visit  Medication Sig Dispense Refill   amLODipine (NORVASC) 10 MG tablet Take 10 mg by mouth daily.      cetirizine (ZYRTEC) 10 MG  tablet Take 10 mg by mouth daily as needed (SINUS ISSUES.).     colchicine 0.6 MG tablet Take 1 tablet by mouth as needed. For gout flares     fluticasone (FLONASE) 50 MCG/ACT nasal spray Place 1-2 sprays into both nostrils daily as needed (SINUS ISSUES.).  losartan (COZAAR) 100 MG tablet Take 100 mg by mouth daily.     pantoprazole (PROTONIX) 40 MG tablet Take 40 mg by mouth daily as needed.     No current facility-administered medications on file prior to visit.    Physical exam:  Today's Vitals   12/18/20 0855  BP: (!) 162/100  Pulse: 71  Weight: 248 lb (112.5 kg)  Height: 5\' 6"  (1.676 m)   Body mass index is 40.03 kg/m.   Wt Readings from Last 3 Encounters:  12/18/20 248 lb (112.5 kg)  12/10/18 227 lb (103 kg)  09/08/17 225 lb (102.1 kg)     Ht Readings from Last 3 Encounters:  12/18/20 5\' 6"  (1.676 m)  12/10/18 5\' 6"  (1.676 m)  09/08/17 5\' 6"  (1.676 m)      General: The patient is awake, alert and appears not in acute distress. The patient is well groomed. Head: Normocephalic, atraumatic. Neck is supple. Mallampati 3 plus,  neck circumference:20 inches . Nasal airflow not fully  patent.  Retrognathia is  seen.  Dental status: poor Cardiovascular:  Regular rate and cardiac rhythm by pulse, possible bruit left carotid artery.  without distended neck veins. Respiratory: Lungs are clear to auscultation.  Skin:  Without evidence of ankle edema, or rash. Trunk: The patient's posture is erect.   Neurologic exam : The patient is awake and alert, oriented to place and time.   Memory subjective described as intact.  Attention span & concentration ability appears normal.  Speech is fluent,  without  dysarthria, dysphonia or aphasia.  Mood and affect are appropriate.   Cranial nerves: no loss of smell or taste reported  Pupils are equal and briskly reactive to light. Funduscopic exam deferred. .  Extraocular movements in vertical and horizontal planes were intact and  without nystagmus. No Diplopia. Visual fields by finger perimetry are intact. Hearing was intact to soft voice and finger rubbing.    Facial sensation intact to fine touch.  Facial motor strength is symmetric and tongue and uvula move midline.  Neck ROM : rotation, tilt and flexion extension were normal for age and shoulder shrug was symmetrical.    Motor exam:  Symmetric bulk, tone and ROM.   Normal tone without cog wheeling, symmetric grip strength .   Sensory:  Fine touch and vibration were tested  and felt at knee, ankle and fingers.  Proprioception tested in the upper extremities was normal. Coordination: Rapid alternating movements in the fingers/hands were of normal speed.  The Finger-to-nose maneuver was intact without evidence of ataxia, dysmetria or tremor.   Gait and station: Patient could rise unassisted from a seated position, walked without assistive device.  Stance is of normal width/ base and the patient turned with 3 steps.  Toe and heel walk were deferred.  Deep tendon reflexes: in the  upper and lower extremities are symmetric and intact.  Babinski response was deferred.      After spending a total time of  50  minutes face to face  including  additional time for physical and neurologic examination, review of laboratory studies,  personal review of imaging studies, reports and results of other testing and review of referral information / records as far as provided in visit, I have established the following assessments:    1) EDS - excessive daytime sleepiness 2) OSA- high risk for  this condition, mouth breathing, frequent nasal and sinus restriction of airflow, and  Elevated neck size, BMI. Mallampati.  3) Until recently  third shift worker, with reported inability to sleep in daytime. Sleep restriction.  4) nocturia, frequent and related to prostate cancer , causing fragmentation of sleep.    My Plan is to proceed with:  1) I will order a PSG/ HST for the time  after his upcoming sinus and nasal surgery, (with Dr Benjamine Mola?).   2) Encouraged RV with urologist.  3) needs HTN treatment with PCP, smoking cessation, weight loss.  4) sleep hygiene , get screen light out of the bedroom.    I would like to thank Shon Baton, MD and Shon Baton, [address] [place],  [place] [address] for allowing me to meet with and to take care of this pleasant patient.   In short, Christopher Stanton is presenting with EDS, a symptom that can be attributed to OSA, and nocturia related fragmentation of sleep. .   I plan to follow up either personally or through our NP within 3-4 month.   CC: I will share my notes with PCP.  Electronically signed by: Larey Seat, MD 12/18/2020 9:27 AM  [facility] Board certified by The [employer] of Sleep Medicine and Diplomate of the [employer] of Sleep Medicine. Board certified In Neurology through the [place], Fellow of the [employer] of Neurology. Medical Director of [facility].

## 2020-12-19 ENCOUNTER — Encounter (HOSPITAL_BASED_OUTPATIENT_CLINIC_OR_DEPARTMENT_OTHER): Payer: Self-pay | Admitting: Otolaryngology

## 2020-12-19 ENCOUNTER — Other Ambulatory Visit: Payer: Self-pay

## 2020-12-21 ENCOUNTER — Other Ambulatory Visit: Payer: Self-pay | Admitting: Otolaryngology

## 2020-12-24 NOTE — Progress Notes (Signed)
Sent text reminding pt to come in for EKG today.  

## 2020-12-25 ENCOUNTER — Encounter (HOSPITAL_BASED_OUTPATIENT_CLINIC_OR_DEPARTMENT_OTHER)
Admission: RE | Disposition: A | Discharge status: Home / Self Care | Payer: Self-pay | Source: Home / Self Care | Attending: Otolaryngology

## 2020-12-25 ENCOUNTER — Encounter (HOSPITAL_BASED_OUTPATIENT_CLINIC_OR_DEPARTMENT_OTHER): Payer: Self-pay | Admitting: Otolaryngology

## 2020-12-25 ENCOUNTER — Ambulatory Visit (HOSPITAL_BASED_OUTPATIENT_CLINIC_OR_DEPARTMENT_OTHER): Payer: 59 | Admitting: Certified Registered"

## 2020-12-25 ENCOUNTER — Other Ambulatory Visit: Payer: Self-pay

## 2020-12-25 ENCOUNTER — Ambulatory Visit (HOSPITAL_BASED_OUTPATIENT_CLINIC_OR_DEPARTMENT_OTHER)
Admission: RE | Admit: 2020-12-25 | Discharge: 2020-12-25 | Disposition: A | Discharge status: Home / Self Care | Payer: 59 | Attending: Otolaryngology | Admitting: Otolaryngology

## 2020-12-25 DIAGNOSIS — F17210 Nicotine dependence, cigarettes, uncomplicated: Secondary | ICD-10-CM | POA: Insufficient documentation

## 2020-12-25 DIAGNOSIS — K219 Gastro-esophageal reflux disease without esophagitis: Secondary | ICD-10-CM | POA: Insufficient documentation

## 2020-12-25 DIAGNOSIS — J343 Hypertrophy of nasal turbinates: Secondary | ICD-10-CM | POA: Insufficient documentation

## 2020-12-25 DIAGNOSIS — E7800 Pure hypercholesterolemia, unspecified: Secondary | ICD-10-CM | POA: Diagnosis not present

## 2020-12-25 DIAGNOSIS — J338 Other polyp of sinus: Secondary | ICD-10-CM | POA: Diagnosis not present

## 2020-12-25 DIAGNOSIS — J3489 Other specified disorders of nose and nasal sinuses: Secondary | ICD-10-CM | POA: Insufficient documentation

## 2020-12-25 DIAGNOSIS — I10 Essential (primary) hypertension: Secondary | ICD-10-CM | POA: Insufficient documentation

## 2020-12-25 DIAGNOSIS — Z6841 Body Mass Index (BMI) 40.0 and over, adult: Secondary | ICD-10-CM | POA: Insufficient documentation

## 2020-12-25 HISTORY — PX: MAXILLARY ANTROSTOMY: SHX2003

## 2020-12-25 HISTORY — PX: TURBINATE REDUCTION: SHX6157

## 2020-12-25 SURGERY — REDUCTION, NASAL TURBINATE
Anesthesia: General | Site: Nose | Laterality: Left

## 2020-12-25 MED ORDER — MIDAZOLAM HCL 2 MG/2ML IJ SOLN
INTRAMUSCULAR | Status: AC
  Filled 2020-12-25: qty 2

## 2020-12-25 MED ORDER — 0.9 % SODIUM CHLORIDE (POUR BTL) OPTIME
TOPICAL | Status: DC | PRN
  Administered 2020-12-25: 70 mL

## 2020-12-25 MED ORDER — HYDRALAZINE HCL 20 MG/ML IJ SOLN
INTRAMUSCULAR | Status: DC | PRN
  Administered 2020-12-25 (×2): 5 mg via INTRAVENOUS

## 2020-12-25 MED ORDER — PROPOFOL 10 MG/ML IV BOLUS
INTRAVENOUS | Status: DC | PRN
  Administered 2020-12-25: 200 mg via INTRAVENOUS

## 2020-12-25 MED ORDER — SUGAMMADEX SODIUM 500 MG/5ML IV SOLN
INTRAVENOUS | Status: DC | PRN
  Administered 2020-12-25: 500 mg via INTRAVENOUS

## 2020-12-25 MED ORDER — CEFAZOLIN SODIUM-DEXTROSE 2-4 GM/100ML-% IV SOLN
INTRAVENOUS | Status: AC
  Filled 2020-12-25: qty 100

## 2020-12-25 MED ORDER — OXYMETAZOLINE HCL 0.05 % NA SOLN
NASAL | Status: DC | PRN
  Administered 2020-12-25: 1 via TOPICAL

## 2020-12-25 MED ORDER — MEPERIDINE HCL 25 MG/ML IJ SOLN: 6.2500 mg | INTRAMUSCULAR | Status: DC | PRN

## 2020-12-25 MED ORDER — ONDANSETRON HCL 4 MG/2ML IJ SOLN: 4.0000 mg | Freq: Once | INTRAMUSCULAR | Status: DC | PRN

## 2020-12-25 MED ORDER — FENTANYL CITRATE (PF) 100 MCG/2ML IJ SOLN
INTRAMUSCULAR | Status: AC
  Filled 2020-12-25: qty 2

## 2020-12-25 MED ORDER — OXYCODONE HCL 5 MG/5ML PO SOLN: 5.0000 mg | Freq: Once | ORAL | Status: DC | PRN

## 2020-12-25 MED ORDER — DEXAMETHASONE SODIUM PHOSPHATE 10 MG/ML IJ SOLN
INTRAMUSCULAR | Status: DC | PRN
  Administered 2020-12-25: 10 mg via INTRAVENOUS

## 2020-12-25 MED ORDER — LIDOCAINE 2% (20 MG/ML) 5 ML SYRINGE
INTRAMUSCULAR | Status: AC
  Filled 2020-12-25: qty 5

## 2020-12-25 MED ORDER — ACETAMINOPHEN 10 MG/ML IV SOLN: 1000.0000 mg | Freq: Once | INTRAVENOUS | Status: DC | PRN

## 2020-12-25 MED ORDER — ACETAMINOPHEN 325 MG PO TABS: 325.0000 mg | ORAL_TABLET | ORAL | Status: DC | PRN

## 2020-12-25 MED ORDER — LABETALOL HCL 5 MG/ML IV SOLN
INTRAVENOUS | Status: DC | PRN
  Administered 2020-12-25: 5 mg via INTRAVENOUS

## 2020-12-25 MED ORDER — MIDAZOLAM HCL 5 MG/5ML IJ SOLN
INTRAMUSCULAR | Status: DC | PRN
  Administered 2020-12-25: 2 mg via INTRAVENOUS

## 2020-12-25 MED ORDER — SODIUM CHLORIDE 0.9 % IV SOLN
INTRAVENOUS | Status: AC | PRN
  Administered 2020-12-25: 500 mL

## 2020-12-25 MED ORDER — ROCURONIUM BROMIDE 100 MG/10ML IV SOLN
INTRAVENOUS | Status: DC | PRN
  Administered 2020-12-25: 100 mg via INTRAVENOUS

## 2020-12-25 MED ORDER — OXYCODONE HCL 5 MG PO TABS: 5.0000 mg | ORAL_TABLET | Freq: Once | ORAL | Status: DC | PRN

## 2020-12-25 MED ORDER — LACTATED RINGERS IV SOLN: INTRAVENOUS | Status: DC

## 2020-12-25 MED ORDER — LIDOCAINE 2% (20 MG/ML) 5 ML SYRINGE
INTRAMUSCULAR | Status: DC | PRN
Start: 2020-12-25 — End: 2020-12-25
  Administered 2020-12-25: 100 mg via INTRAVENOUS

## 2020-12-25 MED ORDER — AMOXICILLIN 875 MG PO TABS: 875.0000 mg | ORAL_TABLET | Freq: Two times a day (BID) | ORAL | 0 refills | Status: AC

## 2020-12-25 MED ORDER — LABETALOL HCL 5 MG/ML IV SOLN
INTRAVENOUS | Status: AC
  Filled 2020-12-25: qty 8

## 2020-12-25 MED ORDER — ACETAMINOPHEN 160 MG/5ML PO SOLN: 325.0000 mg | ORAL | Status: DC | PRN

## 2020-12-25 MED ORDER — FENTANYL CITRATE (PF) 100 MCG/2ML IJ SOLN
INTRAMUSCULAR | Status: DC | PRN
  Administered 2020-12-25: 100 ug via INTRAVENOUS
  Administered 2020-12-25 (×2): 50 ug via INTRAVENOUS

## 2020-12-25 MED ORDER — CEFAZOLIN SODIUM-DEXTROSE 2-3 GM-%(50ML) IV SOLR
INTRAVENOUS | Status: DC | PRN
  Administered 2020-12-25: 2 g via INTRAVENOUS

## 2020-12-25 MED ORDER — OXYCODONE-ACETAMINOPHEN 5-325 MG PO TABS: 1.0000 | ORAL_TABLET | ORAL | 0 refills | Status: AC | PRN

## 2020-12-25 MED ORDER — MUPIROCIN 2 % EX OINT
TOPICAL_OINTMENT | CUTANEOUS | Status: DC | PRN
  Administered 2020-12-25: 1 via NASAL

## 2020-12-25 MED ORDER — SUGAMMADEX SODIUM 500 MG/5ML IV SOLN
INTRAVENOUS | Status: AC
  Filled 2020-12-25: qty 5

## 2020-12-25 MED ORDER — ONDANSETRON HCL 4 MG/2ML IJ SOLN
INTRAMUSCULAR | Status: AC
  Filled 2020-12-25: qty 2

## 2020-12-25 MED ORDER — ONDANSETRON HCL 4 MG/2ML IJ SOLN
INTRAMUSCULAR | Status: DC | PRN
  Administered 2020-12-25: 4 mg via INTRAVENOUS

## 2020-12-25 MED ORDER — PROPOFOL 10 MG/ML IV BOLUS
INTRAVENOUS | Status: AC
  Filled 2020-12-25: qty 20

## 2020-12-25 MED ORDER — DEXAMETHASONE SODIUM PHOSPHATE 10 MG/ML IJ SOLN
INTRAMUSCULAR | Status: AC
  Filled 2020-12-25: qty 1

## 2020-12-25 MED ORDER — ROCURONIUM BROMIDE 10 MG/ML (PF) SYRINGE
PREFILLED_SYRINGE | INTRAVENOUS | Status: AC
  Filled 2020-12-25: qty 10

## 2020-12-25 MED ORDER — FENTANYL CITRATE (PF) 100 MCG/2ML IJ SOLN: 25.0000 ug | INTRAMUSCULAR | Status: DC | PRN

## 2020-12-25 SURGICAL SUPPLY — 37 items
ATTRACTOMAT 16X20 MAGNETIC DRP (DRAPES) IMPLANT
BLADE RAD40 ROTATE 4M 4 5PK (BLADE) IMPLANT
BLADE RAD60 ROTATE M4 4 5PK (BLADE) IMPLANT
BLADE TRICUT ROTATE M4 4 5PK (BLADE) ×1 IMPLANT
CANISTER SUC SOCK COL 7IN (MISCELLANEOUS) ×3 IMPLANT
CANISTER SUCT 1200ML W/VALVE (MISCELLANEOUS) ×4 IMPLANT
COAGULATOR SUCT 8FR VV (MISCELLANEOUS) ×3 IMPLANT
DECANTER SPIKE VIAL GLASS SM (MISCELLANEOUS) IMPLANT
DEFOGGER MIRROR 1QT (MISCELLANEOUS) ×3 IMPLANT
DRSG NASOPORE 8CM (GAUZE/BANDAGES/DRESSINGS) ×1 IMPLANT
ELECT REM PT RETURN 9FT ADLT (ELECTROSURGICAL) ×3
ELECTRODE REM PT RTRN 9FT ADLT (ELECTROSURGICAL) ×2 IMPLANT
GAUZE 4X4 16PLY ~~LOC~~+RFID DBL (SPONGE) ×2 IMPLANT
GLOVE SURG ENC MOIS LTX SZ7.5 (GLOVE) ×3 IMPLANT
GLOVE SURG POLYISO LF SZ6.5 (GLOVE) ×2 IMPLANT
GLOVE SURG UNDER POLY LF SZ7 (GLOVE) ×2 IMPLANT
GOWN STRL REUS W/ TWL LRG LVL3 (GOWN DISPOSABLE) ×5 IMPLANT
GOWN STRL REUS W/TWL LRG LVL3 (GOWN DISPOSABLE) ×9
HEMOSTAT SURGICEL 2X14 (HEMOSTASIS) IMPLANT
IV NS 500ML (IV SOLUTION) ×3
IV NS 500ML BAXH (IV SOLUTION) ×2 IMPLANT
NDL HYPO 25X1 1.5 SAFETY (NEEDLE) IMPLANT
NEEDLE HYPO 25X1 1.5 SAFETY (NEEDLE) IMPLANT
NS IRRIG 1000ML POUR BTL (IV SOLUTION) ×3 IMPLANT
PACK BASIN DAY SURGERY FS (CUSTOM PROCEDURE TRAY) ×3 IMPLANT
PACK ENT DAY SURGERY (CUSTOM PROCEDURE TRAY) ×3 IMPLANT
PATTIES SURGICAL .5 X3 (DISPOSABLE) IMPLANT
SLEEVE SCD COMPRESS KNEE MED (STOCKING) ×2 IMPLANT
SPONGE GAUZE 2X2 8PLY STRL LF (GAUZE/BANDAGES/DRESSINGS) ×3 IMPLANT
SPONGE NEURO XRAY DETECT 1X3 (DISPOSABLE) ×3 IMPLANT
SUCTION FRAZIER HANDLE 10FR (MISCELLANEOUS)
SUCTION TUBE FRAZIER 10FR DISP (MISCELLANEOUS) IMPLANT
SYR 50ML LL SCALE MARK (SYRINGE) IMPLANT
TOWEL GREEN STERILE FF (TOWEL DISPOSABLE) ×3 IMPLANT
TUBE CONNECTING 20X1/4 (TUBING) ×3 IMPLANT
TUBE SALEM SUMP 16 FR W/ARV (TUBING) ×1 IMPLANT
YANKAUER SUCT BULB TIP NO VENT (SUCTIONS) ×2 IMPLANT

## 2020-12-25 NOTE — Discharge Instructions (Addendum)
Post Anesthesia Home Care Instructions  Activity: Get plenty of rest for the remainder of the day. A responsible individual must stay with you for 24 hours following the procedure.  For the next 24 hours, DO NOT: -Drive a car -Paediatric nurse -Drink alcoholic beverages -Take any medication unless instructed by your physician -Make any legal decisions or sign important papers.  Meals: Start with liquid foods such as gelatin or soup. Progress to regular foods as tolerated. Avoid greasy, spicy, heavy foods. If nausea and/or vomiting occur, drink only clear liquids until the nausea and/or vomiting subsides. Call your physician if vomiting continues.  Special Instructions/Symptoms: Your throat may feel dry or sore from the anesthesia or the breathing tube placed in your throat during surgery. If this causes discomfort, gargle with warm salt water. The discomfort should disappear within 24 hours.  If you had a scopolamine patch placed behind your ear for the management of post- operative nausea and/or vomiting:  1. The medication in the patch is effective for 72 hours, after which it should be removed.  Wrap patch in a tissue and discard in the trash. Wash hands thoroughly with soap and water. 2. You may remove the patch earlier than 72 hours if you experience unpleasant side effects which may include dry mouth, dizziness or visual disturbances. 3. Avoid touching the patch. Wash your hands with soap and water after contact with the patch.     -----------------  POSTOPERATIVE INSTRUCTIONS FOR PATIENTS HAVING NASAL OR SINUS OPERATIONS ACTIVITY: Restrict activity at home for the first two days, resting as much as possible. Light activity is best. You may usually return to work within a week. You should refrain from nose blowing, strenuous activity, or heavy lifting greater than 20lbs for a total of one week after your operation.  If sneezing cannot be avoided, sneeze with your mouth  open. DISCOMFORT: You may experience a dull headache and pressure along with nasal congestion and discharge. These symptoms may be worse during the first week after the operation but may last as long as two to four weeks.  Please take Tylenol or the pain medication that has been prescribed for you. Do not take aspirin or aspirin containing medications since they may cause bleeding.  You may experience symptoms of post nasal drainage, nasal congestion, headaches and fatigue for two or three months after your operation.  BLEEDING: You may have some blood tinged nasal drainage for approximately two weeks after the operation.  The discharge will be worse for the first week.  Please call our office at [phone] or go to the nearest hospital emergency room if you experience any of the following: heavy, bright red blood from your nose or mouth that lasts longer than 15 minutes or coughing up or vomiting bright red blood or blood clots. GENERAL CONSIDERATIONS: A gauze dressing will be placed on your upper lip to absorb any drainage after the operation. You may need to change this several times a day.  If you do not have very much drainage, you may remove the dressing.  Remember that you may gently wipe your nose with a tissue and sniff in, but DO NOT blow your nose. Please keep all of your postoperative appointments.  Your final results after the operation will depend on proper follow-up.  The initial visit is usually 2 to 5 days after the operation.  During this visit, the remaining nasal packing and internal septal splints will be removed.  Your nasal and sinus cavities will be  cleaned.  During the second visit, your nasal and sinus cavities will be cleaned again. Have someone drive you to your first two postoperative appointments.  How you care for your nose after the operation will influence the results that you obtain.  You should follow all directions, take your medication as prescribed, and call our office  [phone] with any problems or questions. You may be more comfortable sleeping with your head elevated on two pillows. Do not take any medications that we have not prescribed or recommended. WARNING SIGNS: if any of the following should occur, please call our office: Persistent fever greater than 102F. Persistent vomiting. Severe and constant pain that is not relieved by prescribed pain medication. Trauma to the nose. Rash or unusual side effects from any medicines.

## 2020-12-25 NOTE — Op Note (Signed)
DATE OF PROCEDURE: 12/25/2020  OPERATIVE REPORT   SURGEON: Leta Baptist, MD   PREOPERATIVE DIAGNOSES:  1. Large left antrochoanal polyp 2. Bilateral inferior turbinate hypertrophy.  3. Chronic nasal obstruction.  POSTOPERATIVE DIAGNOSES:  1. Large left antrochoanal polyp 2. Bilateral inferior turbinate hypertrophy.  3. Chronic nasal obstruction.  PROCEDURE PERFORMED:  1. Septoplasty.  2. Bilateral partial inferior turbinate resection.   ANESTHESIA: General endotracheal tube anesthesia.   COMPLICATIONS: None.   ESTIMATED BLOOD LOSS: 200 mL.   INDICATION FOR PROCEDURE: Christopher Stanton is a 55 y.o. male with a history of chronic nasal obstruction and nasal polyp. The patient recently underwent a neck CT scan. He was found to have a large 2.5 cm nasal and nasopharyngeal polyp. The polyp originates in the left maxillary sinus and extends into the nasopharynx. The patient has noted difficulty breathing through his nose for many years. He obstruction is worse at night and worse on the left side.  On examination, he was noted to have bilateral severe inferior turbinate hypertrophy, obstructing more than 95% of his nasal passageways bilaterally.  He has tried steroid sprays in the past with no relief.  Based on the above findings, the decision was made for the patient to undergo the above-stated procedures. The risks, benefits, alternatives, and details of the procedures were discussed with the patient. Questions were invited and answered. Informed consent was obtained.   DESCRIPTION OF PROCEDURE: The patient was taken to the operating room and placed supine on the operating table. General endotracheal tube anesthesia was administered by the anesthesiologist. The patient was positioned, and prepped and draped in the standard fashion for nasal surgery. Pledgets soaked with Afrin were placed in both nasal cavities for decongestion. The pledgets were subsequently removed.   The inferior one half of  both hypertrophied inferior turbinate was crossclamped with a Kelly clamp. The inferior one half of each inferior turbinate was then resected with a pair of cross cutting scissors. Hemostasis was achieved with a suction cautery device.   Using a 0 endoscope, the left nasal cavity was examined.  The left middle turbinate was medialized.  A large antrochoanal polyp was noted, originating from the left maxillary sinus, extending into the nasopharynx.  The entire nasopharynx was obstructed by the large antrochoanal polyp.  The entire antrochoanal polyp was removed in a piecemeal fashion using a combination of Blakesley forceps and microdebrider. The uncinate process was resected with a freer elevator. The maxillary antrum was entered and enlarged using a combination of backbiter and microdebrider. More polypoid tissue was removed from the left maxillary sinus.  The left maxillary sinus was copiously irrigated with saline solution.  The care of the patient was turned over to the anesthesiologist. The patient was awakened from anesthesia without difficulty. The patient was extubated and transferred to the recovery room in good condition.   OPERATIVE FINDINGS: A large left antrochoanal polyp, extending from the left maxillary sinus into the left nasal cavity and the nasopharynx.  Bilateral inferior turbinate hypertrophy.  SPECIMEN: Left antrochoanal polyp.   FOLLOWUP CARE: The patient be discharged home once he is awake and alert. The patient will be placed on Percocet  p.r.n. pain, and amoxicillin 875 mg p.o. b.i.d. for 3 days. The patient will follow up in my office in 6 days for sinus debridement.  Bayan Kushnir Raynelle Bring, MD

## 2020-12-25 NOTE — Anesthesia Postprocedure Evaluation (Signed)
Anesthesia Post Note  Patient: Christopher Stanton  Procedure(s) Performed: BILATERAL TURBINATE REDUCTION (Bilateral: Nose) LEFT MAXILLARY ANTROSTOMY WITH TISSUE REMOVAL (Left: Nose)     Patient location during evaluation: Phase II Anesthesia Type: General Level of consciousness: awake and sedated Pain management: pain level controlled Vital Signs Assessment: post-procedure vital signs reviewed and stable Respiratory status: spontaneous breathing Cardiovascular status: stable Postop Assessment: no apparent nausea or vomiting Anesthetic complications: no   No notable events documented.  Last Vitals:  Vitals:   12/25/20 1115 12/25/20 1116  BP: (!) 176/111 (!) 162/106  Pulse: 65 63  Resp: 18 (!) 22  Temp:    SpO2: 95% 95%    Last Pain:  Vitals:   12/25/20 1115  TempSrc:   PainSc: 0-No pain                 Huston Foley

## 2020-12-25 NOTE — Anesthesia Procedure Notes (Signed)
Procedure Name: Intubation Date/Time: 12/25/2020 9:38 AM Performed by: Lavonia Dana, CRNA Pre-anesthesia Checklist: Patient identified, Emergency Drugs available, Suction available and Patient being monitored Patient Re-evaluated:Patient Re-evaluated prior to induction Oxygen Delivery Method: Circle system utilized Preoxygenation: Pre-oxygenation with 100% oxygen Induction Type: IV induction Ventilation: Mask ventilation without difficulty and Oral airway inserted - appropriate to patient size Laryngoscope Size: Mac and 4 Grade View: Grade II Tube type: Oral Tube size: 7.5 mm Number of attempts: 1 Airway Equipment and Method: Stylet and Oral airway Placement Confirmation: ETT inserted through vocal cords under direct vision, positive ETCO2 and breath sounds checked- equal and bilateral Secured at: 25 cm Tube secured with: Tape Dental Injury: Teeth and Oropharynx as per pre-operative assessment

## 2020-12-25 NOTE — H&P (Signed)
Cc: Chronic nasal obstruction, left nasal polyp  HPI: The patient is a 55 y/o male who presents today for evaluation of his chronic nasal obstruction and nasal polyp. The patient recently underwent a neck CT for evaluation of enlarged cervical lymph nodes. He was found to have a large 2.5 cm nasal and nasopharyngeal polyp. The polyp originates in the left maxillary sinus and extends into the nasopharynx. Also noted was a 1.7cm area in the left parotid gland which was felt to be inflammatory versus a mass. The patient has noted difficulty breathing through his nose for many years. He obstruction is worse at night and worse on the left side. He has tried steroid sprays in the past with no relief. The patient denies facial pain or pressure. Previous ENT surgery is denied.   The patient's review of systems (constitutional, eyes, ENT, cardiovascular, respiratory, GI, musculoskeletal, skin, neurologic, psychiatric, endocrine, hematologic, allergic) is noted in the ROS questionnaire.  It is reviewed with the patient.   Family health history: No HTN, DM, CAD, hearing loss or bleeding disorder.  Major events: None.  Ongoing medical problems: Hypertension,reflux.  Social history: The patient is single. He smokes up to 1 pack of cigarettes a day. He drinks alcohol once a week. He denies the use of illegal drugs.   Exam: General: Communicates without difficulty, well nourished, no acute distress. Head: Normocephalic, no evidence injury, no tenderness, facial buttresses intact without stepoff. Eyes: PERRL, EOMI. No scleral icterus, conjunctivae clear. Neuro: CN II exam reveals vision grossly intact.  No nystagmus at any point of gaze. Ears: Auricles well formed without lesions.  Ear canals are intact without mass or lesion.  No erythema or edema is appreciated.  The TMs are intact without fluid. Nose: External evaluation reveals normal support and skin without lesions.  Dorsum is intact.  Anterior rhinoscopy reveals  congested and edematous mucosa over anterior aspect of the inferior turbinates and nasal septum.  No purulence is noted. Middle meatus is not well visualized. Oral:  Oral cavity and oropharynx are intact, symmetric, without erythema or edema.  Mucosa is moist without lesions. Neck: Full range of motion without pain.  There is no significant lymphadenopathy.  No masses palpable.  Thyroid bed within normal limits to palpation.  Parotid glands and submandibular glands equal bilaterally without mass.  Trachea is midline. Neuro:  CN 2-12 grossly intact. Gait normal. Vestibular: No nystagmus at any point of gaze. A flexible scope was inserted into the right nasal cavity. Endoscopy of the interior nasal cavity, superior, inferior, and middle meatus was performed. The sphenoid-ethmoid recess was examined. Edematous mucosa was noted. Olfactory cleft was clear. Nasopharynx with large polyp noted. Turbinates were hypertrophied but without mass. The procedure was repeated on the contralateral side with a large polyp extending from the maxillary sinus and obstructing his left nostril.   Assessment  1. Large polyp extending from the left maxillary sinus into the nasopharynx. 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 2. Bilateral inferior turbinate hypertrophy. No other suspicious mass or lesion is noted on today's nasal endoscopy.  Plan 1. The physical exam, nasal endoscopy, and CT findings are reviewed with the patient. 2. The patient would benefit from left maxillary antrostomy with polyp removal and bilateral inferior turbinate reduction. The risks, benefits, alternatives, and details of the procedure are reviewed with the patient. Questions are invited and answered. 3. The patient is interested in proceeding with the procedures.  We will schedule the procedure in accordance with the family schedule.

## 2020-12-25 NOTE — Anesthesia Preprocedure Evaluation (Signed)
Anesthesia Evaluation  Patient identified by MRN, date of birth, ID band Patient awake    Reviewed: Allergy & Precautions, NPO status , Patient's Chart, lab work & pertinent test results  Airway Mallampati: II       Dental  (+) Missing, Poor Dentition,    Pulmonary Current Smoker,    Pulmonary exam normal        Cardiovascular hypertension, Pt. on medications Normal cardiovascular exam     Neuro/Psych negative psych ROS   GI/Hepatic GERD  Medicated and Controlled,  Endo/Other  Morbid obesity  Renal/GU   negative genitourinary   Musculoskeletal  (+) Arthritis , Osteoarthritis,    Abdominal (+) + obese,   Peds  Hematology   Anesthesia Other Findings   Reproductive/Obstetrics                             Anesthesia Physical Anesthesia Plan  ASA: 3  Anesthesia Plan: General   Post-op Pain Management:    Induction: Intravenous  PONV Risk Score and Plan: 2 and Ondansetron, Dexamethasone and Midazolam  Airway Management Planned: Oral ETT  Additional Equipment: None  Intra-op Plan:   Post-operative Plan: Extubation in OR  Informed Consent: I have reviewed the patients History and Physical, chart, labs and discussed the procedure including the risks, benefits and alternatives for the proposed anesthesia with the patient or authorized representative who has indicated his/her understanding and acceptance.     Dental advisory given  Plan Discussed with: CRNA  Anesthesia Plan Comments:         Anesthesia Quick Evaluation

## 2020-12-25 NOTE — Transfer of Care (Signed)
Immediate Anesthesia Transfer of Care Note  Patient: Christopher Stanton  Procedure(s) Performed: BILATERAL TURBINATE REDUCTION (Bilateral: Nose) LEFT MAXILLARY ANTROSTOMY WITH TISSUE REMOVAL (Left: Nose)  Patient Location: PACU  Anesthesia Type:General  Level of Consciousness: awake, alert  and oriented  Airway & Oxygen Therapy: Patient Spontanous Breathing and Patient connected to face mask oxygen  Post-op Assessment: Report given to RN and Post -op Vital signs reviewed and stable  Post vital signs: Reviewed and stable  Last Vitals:  Vitals Value Taken Time  BP 188/112 12/25/20 1041  Temp 36.5 C 12/25/20 1037  Pulse 58 12/25/20 1044  Resp 18 12/25/20 1044  SpO2 98 % 12/25/20 1044  Vitals shown include unvalidated device data.  Last Pain:  Vitals:   12/25/20 0837  TempSrc: Oral  PainSc: 0-No pain      Patients Stated Pain Goal: 2 (34/03/70 9643)  Complications: No notable events documented.

## 2020-12-26 LAB — SURGICAL PATHOLOGY

## 2020-12-27 ENCOUNTER — Encounter (HOSPITAL_BASED_OUTPATIENT_CLINIC_OR_DEPARTMENT_OTHER): Payer: Self-pay | Admitting: Otolaryngology

## 2021-01-24 ENCOUNTER — Other Ambulatory Visit: Payer: Self-pay | Admitting: Urology

## 2021-01-24 DIAGNOSIS — C61 Malignant neoplasm of prostate: Secondary | ICD-10-CM

## 2021-02-04 ENCOUNTER — Ambulatory Visit (INDEPENDENT_AMBULATORY_CARE_PROVIDER_SITE_OTHER): Payer: 59 | Admitting: Neurology

## 2021-02-04 DIAGNOSIS — G4719 Other hypersomnia: Secondary | ICD-10-CM

## 2021-02-04 DIAGNOSIS — G4733 Obstructive sleep apnea (adult) (pediatric): Secondary | ICD-10-CM

## 2021-02-04 DIAGNOSIS — I10 Essential (primary) hypertension: Secondary | ICD-10-CM

## 2021-02-04 DIAGNOSIS — H8113 Benign paroxysmal vertigo, bilateral: Secondary | ICD-10-CM

## 2021-02-04 DIAGNOSIS — G4726 Circadian rhythm sleep disorder, shift work type: Secondary | ICD-10-CM

## 2021-02-12 NOTE — Progress Notes (Signed)
[facility] TEST REPORT ( by Watch PAT)   STUDY DATE:  02-12-2021 DOB:  [birth year]    ORDERING CLINICIAN: Larey Seat, MD  REFERRING CLINICIAN:    CLINICAL INFORMATION/HISTORY: Christopher Stanton is a 55 year old African- American patient, seen on 12/18/2020 from PCP for a sleep consultation.  He was just diagnosed with a nasal or sinus polyp and reports a surgery is planned for the 18th October. He presents with high BP.  Chief concern according to patient :  " I am often congested, I was told I snore very loudly and I am breathing through my mouth, its gets so dry it wakes me up. " Paper referral for OSA. No prior SS and no CPAP. Pt has been struggling w sinus problem for over 4 years and felt like something else was wrong and causing sleeping problems. A big polyp was found in his nose that will be removed on Oct 18.   Christopher Stanton  has a past medical history of Arthritis, Colon polyp, GERD (gastroesophageal reflux disease), Gout, History of syncope, Hypercholesteremia, Hypertension, Obesity, Osteoarthritis, Prostate cancer ([place]), he had rising PSA levels, and has been waiting to see Urologist- and Sciatica   Epworth sleepiness score: 15/24.   BMI: 40 kg/m   Neck Circumference: 20"   FINDINGS:   Sleep Summary:   Total Recording Time (hours, min):   Total recording time for this patient amounted to 6 hours 51 minutes of which 6 hours and 8 minutes with a total sleep time.  28% of total sleep time were in rem sleep.Total Sleep Time (hours, min):                                        Respiratory Indices:   Calculated pAHI (per hour):       The overall apnea-hypopnea index was 81.1/h with little variation between REM and non-REM sleep.  REM AHI was 95 and non-REM AHI 77.6/h.  Positional data show a supine AHI of 102.2/h versus a right sided AHI of 59.9/h.  Snoring was loud the mean volume was 42 dB but there were several minutes of sleep  with snoring over 60 dB.  37% of total sleep time were accompanied by snoring.                      Oxygen Saturation Statistics:   O2 Saturation Range (%): Oxygen saturation varied between and nadir of 79% and a maximum 100% with a mean oxygenation of 93%.  Time in hypoxia                                       O2 Saturation (minutes) <89%:     Amounted to 13.3 minutes the equivalent of 3.6% of total sleep time.  Hypoxemia under 90% saturation was 20.6 minutes, the equivalent of 5.6% total sleep time.    Pulse Rate Statistics:          Pulse Range:   Pulse rate varied between 36 and 106 bpm with a mean heart rate of 61 bpm.                 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  There are mild to moderate hypoxia events associated with that and a bradycardia tachycardia was noted.  The patient should avoid sleeping supine but this alone will not treat this degree of apnea.   RECOMMENDATION: This severe apnea should be treated with positive airway pressure.  The patient should be one-on-one fitted for an interface or mask and I would prefer if this can be done in reclined position.  CPAP will be set between 5 and 20 cmH2O pressure was 2 cm EPR, heated humidification of machine and tubing.  Revisit with [place] in 3 months of PAP therapy start.    INTERPRETING PHYSICIAN:   Larey Seat, MD   Medical Director of [facility] at [facility].

## 2021-02-18 ENCOUNTER — Telehealth: Payer: Self-pay

## 2021-02-18 DIAGNOSIS — G4733 Obstructive sleep apnea (adult) (pediatric): Secondary | ICD-10-CM | POA: Insufficient documentation

## 2021-02-18 NOTE — Procedures (Signed)
[facility] [place] TEST REPORT ( by Watch PAT)   STUDY DATE:  02-12-2021 DOB:  October 30, 1965    ORDERING CLINICIAN: Larey Seat, MD  REFERRING CLINICIAN:    CLINICAL INFORMATION/HISTORY: Christopher Stanton is a 55 year old African- American patient, seen on 12/18/2020 from PCP for a sleep consultation.  He was just diagnosed with a nasal or sinus polyp and reports a surgery is planned for the 18th October. He presents with high BP.  Chief concern according to patient :  " I am often congested, I was told I snore very loudly and I am breathing through my mouth, its gets so dry it wakes me up. " Paper referral for OSA. No prior SS and no CPAP. Pt has been struggling w sinus problem for over 4 years and felt like something else was wrong and causing sleeping problems. A big polyp was found in his nose that will be removed on Oct 18.   Christopher Stanton  has a past medical history of Arthritis, Colon polyp, GERD (gastroesophageal reflux disease), Gout, History of syncope, Hypercholesteremia, Hypertension, Obesity, Osteoarthritis, Prostate cancer ([place]), he had rising PSA levels, and has been waiting to see Urologist- and Sciatica   Epworth sleepiness score: 15/24.   BMI: 40 kg/m   Neck Circumference: 20"   FINDINGS:   Sleep Summary:   Total Recording Time (hours, min):   Total recording time for this patient amounted to 6 hours 51 minutes of which 6 hours and 8 minutes with a total sleep time.  28% of total sleep time were in rem sleep.Total Sleep Time (hours, min):                                        Respiratory Indices:   Calculated pAHI (per hour):       The overall apnea-hypopnea index was 81.1/h with little variation between REM and non-REM sleep.  REM AHI was 95 and non-REM AHI 77.6/h.  Positional data show a supine AHI of 102.2/h versus a right sided AHI of 59.9/h.  Snoring was loud the mean volume was 42 dB but there were several minutes of sleep with snoring over  60 dB.  37% of total sleep time were accompanied by snoring.                      Oxygen Saturation Statistics:   O2 Saturation Range (%): Oxygen saturation varied between and nadir of 79% and a maximum 100% with a mean oxygenation of 93%.  Time in hypoxia                                       O2 Saturation (minutes) <89%:     Amounted to 13.3 minutes the equivalent of 3.6% of total sleep time.  Hypoxemia under 90% saturation was 20.6 minutes, the equivalent of 5.6% total sleep time.    Pulse Rate Statistics:          Pulse Range:   Pulse rate varied between 36 and 106 bpm with a mean heart rate of 61 bpm.                 IMPRESSION:  This HST confirms the presence of extremely severe sleep apnea of obstructive origin.  There  are mild to moderate hypoxia events associated with that and a bradycardia tachycardia was noted.  The patient should avoid sleeping supine but this alone will not treat this degree of apnea.   RECOMMENDATION: This severe apnea should be treated with positive airway pressure.  The patient should be one-on-one fitted for an interface or mask and I would prefer if this can be done in reclined position.  CPAP will be set between 5 and 20 cmH2O pressure was 2 cm EPR, heated humidification of machine and tubing.  Revisit with [place] in 3 months of PAP therapy start.    INTERPRETING PHYSICIAN:   Larey Seat, MD   Medical Director of [facility] at [facility].

## 2021-02-18 NOTE — Addendum Note (Signed)
Addended by: Larey Seat on: 02/18/2021 12:45 PM   Modules accepted: Orders

## 2021-02-18 NOTE — Telephone Encounter (Signed)
-----   Message from Larey Seat, MD sent at 02/18/2021 12:45 PM EST ----- Pulse Range: Pulse rate varied between 36 and 106 bpm with a mean heart rate of 61 bpm.  IMPRESSION:  This HST confirms the presence of extremely severe sleep apnea of obstructive origin.  There are mild to moderate hypoxia events associated with that and a bradycardia tachycardia was noted.  The patient should avoid sleeping supine but this alone will not treat this degree of apnea.  RECOMMENDATION: This severe apnea should be treated with positive airway pressure.  The patient should be one-on-one fitted for an interface or mask and I would prefer if this can be done in reclined position.  CPAP will be set between 5 and 20 cmH2O pressure was 2 cm EPR, heated humidification of machine and tubing.  Revisit with [place] in 3 months of PAP therapy start.   INTERPRETING PHYSICIAN:   Larey Seat, MD

## 2021-02-18 NOTE — Telephone Encounter (Signed)
I called pt. I advised pt that Dr. Brett Fairy reviewed their sleep study results and found that pt has severe osa. Dr. Brett Fairy recommends that pt start an auto pap at home. I reviewed PAP compliance expectations with the pt. Pt is agreeable to starting an auto-PAP. I advised pt that an order will be sent to a DME, [facility], and [facility] will call the pt within about one week after they file with the pt's insurance. [facility] will show the pt how to use the machine, fit for masks, and troubleshoot the auto-PAP if needed. A follow up appt was made for insurance purposes with Dr. Brett Fairy on 05/23/21 at 8:30am. Pt verbalized understanding to arrive 15 minutes early and bring their auto-PAP. A letter with all of this information in it will be mailed to the pt as a reminder. I verified with the pt that the address we have on file is correct. Pt verbalized understanding of results. Pt had no questions at this time but was encouraged to call back if questions arise. I have sent the order to [facility] and have received confirmation that they have received the order.

## 2021-02-20 ENCOUNTER — Ambulatory Visit
Admission: RE | Admit: 2021-02-20 | Discharge: 2021-02-20 | Disposition: A | Discharge status: Home / Self Care | Payer: 59 | Source: Ambulatory Visit | Attending: Urology | Admitting: Urology

## 2021-02-20 ENCOUNTER — Other Ambulatory Visit: Payer: Self-pay

## 2021-02-20 DIAGNOSIS — C61 Malignant neoplasm of prostate: Secondary | ICD-10-CM

## 2021-02-20 MED ORDER — GADOBENATE DIMEGLUMINE 529 MG/ML IV SOLN
20.0000 mL | Freq: Once | INTRAVENOUS | Status: AC | PRN
  Administered 2021-02-20: 09:00:00 20 mL via INTRAVENOUS

## 2021-02-27 ENCOUNTER — Other Ambulatory Visit: Payer: Self-pay | Admitting: Urology

## 2021-02-27 DIAGNOSIS — D4011 Neoplasm of uncertain behavior of right testis: Secondary | ICD-10-CM

## 2021-03-06 ENCOUNTER — Inpatient Hospital Stay: Admission: RE | Admit: 2021-03-06 | Payer: 59 | Source: Ambulatory Visit

## 2021-03-07 ENCOUNTER — Ambulatory Visit
Admission: RE | Admit: 2021-03-07 | Discharge: 2021-03-07 | Disposition: A | Discharge status: Home / Self Care | Payer: 59 | Source: Ambulatory Visit | Attending: Urology | Admitting: Urology

## 2021-03-07 DIAGNOSIS — D4011 Neoplasm of uncertain behavior of right testis: Secondary | ICD-10-CM

## 2021-03-18 NOTE — Progress Notes (Incomplete)
GU Location of Tumor / Histology: Prostate Ca  If Prostate Cancer, Gleason Score is (3 + 3) and PSA is (5.1 as of 02/2016)  Biopsies: Dr. Alyson Ingles       Past/Anticipated interventions by urology, if any:   Weight changes, if any:   IPSS: SHIM:  Bowel/Bladder complaints, if any:    Nausea/Vomiting, if any:   Pain issues, if any:    SAFETY ISSUES: Prior radiation?  Pacemaker/ICD?  Possible current pregnancy?  Male Is the patient on methotrexate? No  Current Complaints / other details:  Need more information on treatment options.

## 2021-03-22 ENCOUNTER — Telehealth: Payer: Self-pay | Admitting: Radiation Oncology

## 2021-03-26 ENCOUNTER — Ambulatory Visit: Payer: 59

## 2021-03-26 ENCOUNTER — Ambulatory Visit: Payer: Self-pay | Admitting: Family Medicine

## 2021-03-26 ENCOUNTER — Ambulatory Visit: Payer: 59 | Admitting: Radiation Oncology

## 2021-03-26 DIAGNOSIS — C61 Malignant neoplasm of prostate: Secondary | ICD-10-CM

## 2021-04-22 NOTE — Telephone Encounter (Signed)
We have received a notification from [facility] stating they have made multiple attempts to contact the patient and he has not contacted them back. Pt should call [facility] when ready to set up.

## 2021-04-23 NOTE — Telephone Encounter (Signed)
Received notice from Dr. Virgina Jock that patient has not been set up on auto-pap and he asked that someone contact him about this.  I called patient to ask him to call [facility] at P: [phone]. No answer, voicemail not set up.  I will send him a mychart message.

## 2021-04-25 ENCOUNTER — Other Ambulatory Visit: Payer: Self-pay | Admitting: Internal Medicine

## 2021-04-25 DIAGNOSIS — E785 Hyperlipidemia, unspecified: Secondary | ICD-10-CM

## 2021-05-23 ENCOUNTER — Ambulatory Visit: Payer: Self-pay | Admitting: Neurology

## 2021-05-30 ENCOUNTER — Ambulatory Visit
Admission: RE | Admit: 2021-05-30 | Discharge: 2021-05-30 | Disposition: A | Discharge status: Home / Self Care | Payer: No Typology Code available for payment source | Source: Ambulatory Visit | Attending: Internal Medicine | Admitting: Internal Medicine

## 2021-05-30 DIAGNOSIS — E785 Hyperlipidemia, unspecified: Secondary | ICD-10-CM

## 2021-05-31 ENCOUNTER — Other Ambulatory Visit: Payer: Self-pay

## 2022-02-05 DIAGNOSIS — Z8249 Family history of ischemic heart disease and other diseases of the circulatory system: Secondary | ICD-10-CM | POA: Diagnosis not present

## 2022-02-05 DIAGNOSIS — M1990 Unspecified osteoarthritis, unspecified site: Secondary | ICD-10-CM | POA: Diagnosis not present

## 2022-02-05 DIAGNOSIS — K219 Gastro-esophageal reflux disease without esophagitis: Secondary | ICD-10-CM | POA: Diagnosis not present

## 2022-02-05 DIAGNOSIS — Z803 Family history of malignant neoplasm of breast: Secondary | ICD-10-CM | POA: Diagnosis not present

## 2022-02-05 DIAGNOSIS — Z6839 Body mass index (BMI) 39.0-39.9, adult: Secondary | ICD-10-CM | POA: Diagnosis not present

## 2022-02-05 DIAGNOSIS — Z833 Family history of diabetes mellitus: Secondary | ICD-10-CM | POA: Diagnosis not present

## 2022-02-05 DIAGNOSIS — Z87891 Personal history of nicotine dependence: Secondary | ICD-10-CM | POA: Diagnosis not present

## 2022-02-05 DIAGNOSIS — C61 Malignant neoplasm of prostate: Secondary | ICD-10-CM | POA: Diagnosis not present

## 2022-02-05 DIAGNOSIS — M109 Gout, unspecified: Secondary | ICD-10-CM | POA: Diagnosis not present

## 2022-02-05 DIAGNOSIS — I10 Essential (primary) hypertension: Secondary | ICD-10-CM | POA: Diagnosis not present

## 2022-02-05 DIAGNOSIS — G4733 Obstructive sleep apnea (adult) (pediatric): Secondary | ICD-10-CM | POA: Diagnosis not present

## 2022-04-25 DIAGNOSIS — E785 Hyperlipidemia, unspecified: Secondary | ICD-10-CM | POA: Diagnosis not present

## 2022-04-25 DIAGNOSIS — E559 Vitamin D deficiency, unspecified: Secondary | ICD-10-CM | POA: Diagnosis not present

## 2022-04-25 DIAGNOSIS — I10 Essential (primary) hypertension: Secondary | ICD-10-CM | POA: Diagnosis not present

## 2022-04-25 DIAGNOSIS — K219 Gastro-esophageal reflux disease without esophagitis: Secondary | ICD-10-CM | POA: Diagnosis not present

## 2022-04-25 DIAGNOSIS — R7989 Other specified abnormal findings of blood chemistry: Secondary | ICD-10-CM | POA: Diagnosis not present

## 2022-05-01 DIAGNOSIS — G4733 Obstructive sleep apnea (adult) (pediatric): Secondary | ICD-10-CM | POA: Diagnosis not present

## 2022-05-01 DIAGNOSIS — I10 Essential (primary) hypertension: Secondary | ICD-10-CM | POA: Diagnosis not present

## 2022-05-01 DIAGNOSIS — Z23 Encounter for immunization: Secondary | ICD-10-CM | POA: Diagnosis not present

## 2022-05-01 DIAGNOSIS — N1831 Chronic kidney disease, stage 3a: Secondary | ICD-10-CM | POA: Diagnosis not present

## 2022-05-01 DIAGNOSIS — Z0000 Encounter for general adult medical examination without abnormal findings: Secondary | ICD-10-CM | POA: Diagnosis not present

## 2022-05-01 DIAGNOSIS — R9431 Abnormal electrocardiogram [ECG] [EKG]: Secondary | ICD-10-CM | POA: Diagnosis not present

## 2022-05-01 DIAGNOSIS — E785 Hyperlipidemia, unspecified: Secondary | ICD-10-CM | POA: Diagnosis not present

## 2022-05-01 DIAGNOSIS — R739 Hyperglycemia, unspecified: Secondary | ICD-10-CM | POA: Diagnosis not present

## 2022-05-01 DIAGNOSIS — E559 Vitamin D deficiency, unspecified: Secondary | ICD-10-CM | POA: Diagnosis not present

## 2022-05-01 DIAGNOSIS — R82998 Other abnormal findings in urine: Secondary | ICD-10-CM | POA: Diagnosis not present

## 2022-05-01 DIAGNOSIS — M1990 Unspecified osteoarthritis, unspecified site: Secondary | ICD-10-CM | POA: Diagnosis not present

## 2022-05-01 DIAGNOSIS — E669 Obesity, unspecified: Secondary | ICD-10-CM | POA: Diagnosis not present

## 2022-05-01 DIAGNOSIS — K219 Gastro-esophageal reflux disease without esophagitis: Secondary | ICD-10-CM | POA: Diagnosis not present

## 2022-05-14 ENCOUNTER — Other Ambulatory Visit: Payer: Self-pay | Admitting: Urology

## 2022-05-14 ENCOUNTER — Other Ambulatory Visit (HOSPITAL_COMMUNITY): Payer: Self-pay | Admitting: Urology

## 2022-05-14 DIAGNOSIS — C61 Malignant neoplasm of prostate: Secondary | ICD-10-CM

## 2022-05-14 DIAGNOSIS — R3121 Asymptomatic microscopic hematuria: Secondary | ICD-10-CM | POA: Diagnosis not present

## 2022-05-23 NOTE — Patient Instructions (Signed)
SURGICAL WAITING ROOM VISITATION  Patients having surgery or a procedure may have no more than 2 support people in the waiting area - these visitors may rotate.    Children under the age of 47 must have an adult with them who is not the patient.  Due to an increase in RSV and influenza rates and associated hospitalizations, children ages 62 and under may not visit patients in [place].  If the patient needs to stay at the hospital during part of their recovery, the visitor guidelines for inpatient rooms apply. Pre-op nurse will coordinate an appropriate time for 1 support person to accompany patient in pre-op.  This support person may not rotate.    Please refer to the [facility] website for the visitor guidelines for Inpatients (after your surgery is over and you are in a regular room).    Your procedure is scheduled on: 06/05/22   Report to [facility] Main Entrance    Report to admitting at 12:15 PM   Call this number if you have problems the morning of surgery [phone]   Do not eat food or drink liquids :After Midnight.          If you have questions, please contact your surgeon's office.   FOLLOW BOWEL PREP AND ANY ADDITIONAL PRE OP INSTRUCTIONS YOU RECEIVED FROM YOUR SURGEON'S OFFICE!!!     Oral Hygiene is also important to reduce your risk of infection.                                    Remember - BRUSH YOUR TEETH THE MORNING OF SURGERY WITH YOUR REGULAR TOOTHPASTE  DENTURES WILL BE REMOVED PRIOR TO SURGERY PLEASE DO NOT APPLY "Poly grip" OR ADHESIVES!!!   Do NOT smoke after Midnight   Take these medicines the morning of surgery with A SIP OF WATER: Amlodipine, Zyrtec, Pantoprazole   DO NOT TAKE ANY ORAL DIABETIC MEDICATIONS DAY OF YOUR SURGERY  Bring CPAP mask and tubing day of surgery.                              You may not have any metal on your body including jewelry, and body piercing             Do not wear lotions, powders,  cologne, or deodorant  Do not shave  48 hours prior to surgery.               Men may shave face and neck.   Do not bring valuables to the hospital. [place].   Contacts, glasses, dentures or bridgework may not be worn into surgery.  DO NOT [place]. PHARMACY WILL DISPENSE MEDICATIONS LISTED ON YOUR MEDICATION LIST TO YOU DURING YOUR ADMISSION [place]!    Patients discharged on the day of surgery will not be allowed to drive home.  Someone NEEDS to stay with you for the first 24 hours after anesthesia.   Special Instructions: Bring a copy of your healthcare power of attorney and living will documents the day of surgery if you haven't scanned them before.              Please read over the following fact sheets  you were given: IF YOU HAVE QUESTIONS ABOUT YOUR PRE-OP INSTRUCTIONS PLEASE CALL [phone] Schneiders    If you received a COVID test during your pre-op visit  it is requested that you wear a mask when out in public, stay away from anyone that may not be feeling well and notify your surgeon if you develop symptoms. If you test positive for Covid or have been in contact with anyone that has tested positive in the last 10 days please notify you surgeon.    [facility] - Preparing for Surgery Before surgery, you can play an important role.  Because skin is not sterile, your skin needs to be as free of germs as possible.  You can reduce the number of germs on your skin by washing with CHG (chlorahexidine gluconate) soap before surgery.  CHG is an antiseptic cleaner which kills germs and bonds with the skin to continue killing germs even after washing. Please DO NOT use if you have an allergy to CHG or antibacterial soaps.  If your skin becomes reddened/irritated stop using the CHG and inform your nurse when you arrive at Short Stay. Do not shave (including legs and underarms) for at least 48 hours  prior to the first CHG shower.  You may shave your face/neck.  Please follow these instructions carefully:  1.  Shower with CHG Soap the night before surgery and the  morning of surgery.  2.  If you choose to wash your hair, wash your hair first as usual with your normal  shampoo.  3.  After you shampoo, rinse your hair and body thoroughly to remove the shampoo.                             4.  Use CHG as you would any other liquid soap.  You can apply chg directly to the skin and wash.  Gently with a scrungie or clean washcloth.  5.  Apply the CHG Soap to your body ONLY FROM THE NECK DOWN.   Do   not use on face/ open                           Wound or open sores. Avoid contact with eyes, ears mouth and   genitals (private parts).                       Wash face,  Genitals (private parts) with your normal soap.             6.  Wash thoroughly, paying special attention to the area where your    surgery  will be performed.  7.  Thoroughly rinse your body with warm water from the neck down.  8.  DO NOT shower/wash with your normal soap after using and rinsing off the CHG Soap.                9.  Pat yourself dry with a clean towel.            10.  Wear clean pajamas.            11.  Place clean sheets on your bed the night of your first shower and do not  sleep with pets. Day of Surgery : Do not apply any lotions/deodorants the morning of surgery.  Please wear clean clothes to the hospital/surgery center.  FAILURE TO FOLLOW THESE INSTRUCTIONS  MAY RESULT IN THE CANCELLATION OF YOUR SURGERY  PATIENT SIGNATURE_________________________________  NURSE SIGNATURE__________________________________  ________________________________________________________________________

## 2022-05-23 NOTE — Progress Notes (Signed)
COVID Vaccine Completed:  Date of COVID positive in last 90 days:  PCP - Shon Baton, MD Cardiologist -   Chest x-ray -  EKG -  Stress Test -  ECHO -  Cardiac Cath -  Pacemaker/ICD device last checked: Spinal Cord Stimulator:  Bowel Prep -   Sleep Study -  CPAP -   Fasting Blood Sugar -  Checks Blood Sugar _____ times a day  Last dose of GLP1 agonist-  N/A GLP1 instructions:  N/A   Last dose of SGLT-2 inhibitors-  N/A SGLT-2 instructions: N/A   Blood Thinner Instructions: Aspirin Instructions: Last Dose:  Activity level:  Can go up a flight of stairs and perform activities of daily living without stopping and without symptoms of chest pain or shortness of breath.  Able to exercise without symptoms  Unable to go up a flight of stairs without symptoms of     Anesthesia review:   Patient denies shortness of breath, fever, cough and chest pain at PAT appointment  Patient verbalized understanding of instructions that were given to them at the PAT appointment. Patient was also instructed that they will need to review over the PAT instructions again at home before surgery.

## 2022-05-27 ENCOUNTER — Encounter (HOSPITAL_COMMUNITY)
Admission: RE | Admit: 2022-05-27 | Discharge: 2022-05-27 | Disposition: A | Discharge status: Home / Self Care | Payer: 59 | Source: Ambulatory Visit | Attending: Anesthesiology | Admitting: Anesthesiology

## 2022-05-27 DIAGNOSIS — I10 Essential (primary) hypertension: Secondary | ICD-10-CM

## 2022-06-05 ENCOUNTER — Ambulatory Visit (HOSPITAL_COMMUNITY): Admission: RE | Admit: 2022-06-05 | Payer: 59 | Source: Home / Self Care | Admitting: Urology

## 2022-06-05 ENCOUNTER — Ambulatory Visit (HOSPITAL_COMMUNITY): Payer: 59

## 2022-06-05 ENCOUNTER — Encounter (HOSPITAL_COMMUNITY): Admission: RE | Payer: Self-pay | Source: Home / Self Care

## 2022-06-05 SURGERY — CYSTOSCOPY
Anesthesia: Monitor Anesthesia Care

## 2022-06-10 ENCOUNTER — Ambulatory Visit: Payer: Self-pay | Admitting: Family Medicine

## 2022-07-30 DIAGNOSIS — Z8546 Personal history of malignant neoplasm of prostate: Secondary | ICD-10-CM | POA: Diagnosis not present

## 2022-07-30 DIAGNOSIS — Z8249 Family history of ischemic heart disease and other diseases of the circulatory system: Secondary | ICD-10-CM | POA: Diagnosis not present

## 2022-07-30 DIAGNOSIS — Z008 Encounter for other general examination: Secondary | ICD-10-CM | POA: Diagnosis not present

## 2022-07-30 DIAGNOSIS — Z833 Family history of diabetes mellitus: Secondary | ICD-10-CM | POA: Diagnosis not present

## 2022-07-30 DIAGNOSIS — Z91010 Allergy to peanuts: Secondary | ICD-10-CM | POA: Diagnosis not present

## 2022-07-30 DIAGNOSIS — Z87891 Personal history of nicotine dependence: Secondary | ICD-10-CM | POA: Diagnosis not present

## 2022-07-30 DIAGNOSIS — E785 Hyperlipidemia, unspecified: Secondary | ICD-10-CM | POA: Diagnosis not present

## 2022-07-30 DIAGNOSIS — K219 Gastro-esophageal reflux disease without esophagitis: Secondary | ICD-10-CM | POA: Diagnosis not present

## 2022-07-30 DIAGNOSIS — M1990 Unspecified osteoarthritis, unspecified site: Secondary | ICD-10-CM | POA: Diagnosis not present

## 2022-07-30 DIAGNOSIS — I129 Hypertensive chronic kidney disease with stage 1 through stage 4 chronic kidney disease, or unspecified chronic kidney disease: Secondary | ICD-10-CM | POA: Diagnosis not present

## 2022-07-30 DIAGNOSIS — N189 Chronic kidney disease, unspecified: Secondary | ICD-10-CM | POA: Diagnosis not present

## 2022-07-30 DIAGNOSIS — N529 Male erectile dysfunction, unspecified: Secondary | ICD-10-CM | POA: Diagnosis not present

## 2022-10-13 NOTE — Progress Notes (Deleted)
PATIENT: Christopher Stanton DOB: [birth year]  REASON FOR VISIT: follow up HISTORY FROM: patient  No chief complaint on file.    HISTORY OF PRESENT ILLNESS:  10/13/22 ALL:  Christopher Stanton is a 57 y.o. male here today for follow up. He was seen in consult with Dr Vickey Huger 12/2020 for concerns of sleep apnea. HST 01/2021 showed severe OSA with overall AHI 81.1/h, supine AHI 102.2/h and O2 nadir 79%. AutoPAP advised. Per Epic, multiple attempts were made to contact patient to get him set up with autoPAP.     HISTORY: (copied from Dr Dohmeier's previous note)  Christopher Stanton is a 57 y.o. year old African American male and is seen on 12/18/2020 from PCP for a sleep consultation.  He was just diagnosed with a nasal or sinus polyp and reports a surgery is planned for the 18th October. He presents with high BP.  Chief concern according to patient :  " I am often congested, I was told I snore very loudly and I am breathing through my mouth, its gets so dry it wakes me up. "  Christopher Stanton  has a past medical history of Arthritis, Colon polyp, GERD (gastroesophageal reflux disease), Gout, History of syncope, Hypercholesteremia, Hypertension, Obesity, Osteoarthritis, Prostate cancer (HCC), he had rising PSA levels, and has been waiting to see Urologist- and Sciatica.    Sleep relevant medical history: Nocturia 3 or more ,no Tonsillectomy, sinus polyp.   IMPRESSION: 1. No enlarged lymph nodes in the neck. In particular, no enlarged lymph nodes posterior to the ear bilaterally. 2. 17 mm lesion in the left parotid tail. This may be inflamed parotid tissue. Correlate with physical exam and symptoms. 3. 25 mm polyp in the posterior nasopharynx. Recommend direct visualization.  Family medical /sleep history: one of his 5 sisters with OSA.    Social history:  Patient is working for [employer]  and lives in a household with alone. Family status is single ,2 daughters.  Pets are not  present. Tobacco use- 1 ppd.   ETOH use , Caffeine intake d/c 14 days ago-in form of Coffee( /) Soda( /) Tea ( /) or energy drinks. Regular exercise.    Sleep habits are as follows: The patient's dinner time is between 5-7 PM. The patient goes to bed at 10 PM and continues to sleep for 6 hours, wakes for 3-4 bathroom breaks, the first time at 1 AM.   The preferred sleep position is right side , with the support of 2 pillows.  Bedroom is dark, cool, quiet. He needs background TV on, he reports.  Dreams are reportedly rare.   5 AM is the usual rise time. The patient wakes up spontaneously 4.15 AM.  He reports not feeling refreshed or restored in AM, with symptoms such as dry mouth, morning headaches, and residual fatigue.  Naps are taken frequently, whenever not physically active and not stimulated, lasting from 45 to 60 minutes and are more refreshing than nocturnal sleep.    REVIEW OF SYSTEMS: Out of a complete 14 system review of symptoms, the patient complains only of the following symptoms, and all other reviewed systems are negative.  ESS: previously 15/24  ALLERGIES: No Known Allergies  HOME MEDICATIONS: Outpatient Medications Prior to Visit  Medication Sig Dispense Refill   amLODipine (NORVASC) 10 MG tablet Take 10 mg by mouth daily.      cetirizine (ZYRTEC) 10 MG tablet Take 10 mg by mouth daily as needed (SINUS ISSUES.).  colchicine 0.6 MG tablet Take 1 tablet by mouth as needed. For gout flares     losartan (COZAAR) 100 MG tablet Take 100 mg by mouth daily.     pantoprazole (PROTONIX) 40 MG tablet Take 40 mg by mouth daily as needed.     No facility-administered medications prior to visit.    PAST MEDICAL HISTORY: Past Medical History:  Diagnosis Date   Arthritis    Colon polyp    GERD (gastroesophageal reflux disease)    Gout    History of syncope    Hypercholesteremia    Hypertension    Obesity    Osteoarthritis    Prostate cancer (HCC)    Sciatica      PAST SURGICAL HISTORY: Past Surgical History:  Procedure Laterality Date   COLONOSCOPY N/A 12/10/2018   Procedure: COLONOSCOPY;  Surgeon: West Bali, MD;  [facility];  Service: Endoscopy;  Laterality: N/A;  1:00   MAXILLARY ANTROSTOMY Left 12/25/2020   Procedure: LEFT MAXILLARY ANTROSTOMY WITH TISSUE REMOVAL;  Surgeon: Newman Pies, MD;  Location: [facility];  Service: ENT;  Laterality: Left;   PROSTATE BIOPSY     TURBINATE REDUCTION Bilateral 12/25/2020   Procedure: BILATERAL TURBINATE REDUCTION;  Surgeon: Newman Pies, MD;  Location:  SUR[facility]ERY [facility];  Service: ENT;  Laterality: Bilateral;    FAMILY HISTORY: Family History  Problem Relation Age of Onset   Heart attack Mother    Cancer Father 30       prostate   Cancer Sister        breast   Colon cancer Neg Hx    Colon polyps Neg Hx     SOCIAL HISTORY: Social History   Socioeconomic History   Marital status: Divorced    Spouse name: Not on file   Number of children: 2   Years of education: Not on file   Highest education level: High school graduate  Occupational History   Not on file  Tobacco Use   Smoking status: Every Day    Current packs/day: 1.00    Average packs/day: 1 pack/day for 27.0 years (27.0 ttl pk-yrs)    Types: Cigarettes   Smokeless tobacco: Never  Substance and Sexual Activity   Alcohol use: Yes    Comment: 3 margaritas on Friday    Drug use: Yes    Types: Marijuana    Comment: last smoked 12-16-20   Sexual activity: Not on file  Other Topics Concern   Not on file  Social History Narrative   WORKS AT [employer]. DIVORCED. 2 W[employer]W('11, '92). SPENDS FREE TIME: SIDE JOBS, 4 WHEELING., FISHES BUT PUTS THEM BACK.   Lives alone   Right handed   Caffeine: none   Social Determinants of Health   Financial Resource Strain: Not on file  Food Insecurity: Not on file  Transportation Needs: Not on file  Physical Activity: Not on file  Stress: Not on file   Social Connections: Not on file  Intimate Partner Violence: Not on file     PHYSICAL EXAM  There were no vitals filed for this visit. There is no height or weight on file to calculate BMI.  Generalized: Well developed, in no acute distress  Cardiology: normal rate and rhythm, no murmur noted Respiratory: clear to auscultation bilaterally  Neurological examination  Mentation: Alert oriented to time, place, history taking. Follows all commands speech and language fluent Cranial nerve II-XII: Pupils were equal round reactive to light. Extraocular movements were full,  visual field were full  Motor: The motor testing reveals 5 over 5 strength of all 4 extremities. Good symmetric motor tone is noted throughout.  Gait and station: Gait is normal.    DIAGNOSTIC DATA (LABS, IMAGING, TESTING) - I reviewed patient records, labs, notes, testing and imaging myself where available.      No data to display           No results found for: "WBC", "HGB", "HCT", "MCV", "PLT" No results found for: "NA", "K", "CL", "CO2", "GLUCOSE", "BUN", "CREATININE", "CALCIUM", "PROT", "ALBUMIN", "AST", "ALT", "ALKPHOS", "BILITOT", "GFRNONAA", "GFRAA" No results found for: "CHOL", "HDL", "LDLCALC", "LDLDIRECT", "TRIG", "CHOLHDL" No results found for: "HGBA1C" No results found for: "VITAMINB12" No results found for: "TSH"   ASSESSMENT AND PLAN 57 y.o. year old male  has a past medical history of Arthritis, Colon polyp, GERD (gastroesophageal reflux disease), Gout, History of syncope, Hypercholesteremia, Hypertension, Obesity, Osteoarthritis, Prostate cancer (HCC), and Sciatica. here with   No diagnosis found.    Christopher Stanton is doing well on CPAP therapy. Compliance report reveals ***. *** was encouraged to continue using CPAP nightly and for greater than 4 hours each night. We will update supply orders as indicated. Risks of untreated sleep apnea review and education materials provided. Healthy  lifestyle habits encouraged. *** will follow up in ***, sooner if needed. *** verbalizes understanding and agreement with this plan.    No orders of the defined types were placed in this encounter.    No orders of the defined types were placed in this encounter.     Shawnie Dapper, FNP-C 10/13/2022, 8:38 AM [facility] [address], [address] [place], [place] [address] [phone]

## 2022-10-14 ENCOUNTER — Ambulatory Visit: Payer: 59 | Admitting: Family Medicine

## 2022-11-14 DIAGNOSIS — E559 Vitamin D deficiency, unspecified: Secondary | ICD-10-CM | POA: Diagnosis not present

## 2022-11-14 DIAGNOSIS — E669 Obesity, unspecified: Secondary | ICD-10-CM | POA: Diagnosis not present

## 2022-11-14 DIAGNOSIS — F17200 Nicotine dependence, unspecified, uncomplicated: Secondary | ICD-10-CM | POA: Diagnosis not present

## 2022-11-14 DIAGNOSIS — M1990 Unspecified osteoarthritis, unspecified site: Secondary | ICD-10-CM | POA: Diagnosis not present

## 2022-11-14 DIAGNOSIS — G4733 Obstructive sleep apnea (adult) (pediatric): Secondary | ICD-10-CM | POA: Diagnosis not present

## 2022-11-14 DIAGNOSIS — K219 Gastro-esophageal reflux disease without esophagitis: Secondary | ICD-10-CM | POA: Diagnosis not present

## 2022-11-14 DIAGNOSIS — I10 Essential (primary) hypertension: Secondary | ICD-10-CM | POA: Diagnosis not present

## 2022-11-14 DIAGNOSIS — Z87898 Personal history of other specified conditions: Secondary | ICD-10-CM | POA: Diagnosis not present

## 2022-11-14 DIAGNOSIS — R9431 Abnormal electrocardiogram [ECG] [EKG]: Secondary | ICD-10-CM | POA: Diagnosis not present

## 2022-11-14 DIAGNOSIS — E785 Hyperlipidemia, unspecified: Secondary | ICD-10-CM | POA: Diagnosis not present

## 2022-11-14 DIAGNOSIS — N1831 Chronic kidney disease, stage 3a: Secondary | ICD-10-CM | POA: Diagnosis not present

## 2022-11-14 DIAGNOSIS — C61 Malignant neoplasm of prostate: Secondary | ICD-10-CM | POA: Diagnosis not present

## 2022-11-18 ENCOUNTER — Other Ambulatory Visit: Payer: Self-pay | Admitting: Urology

## 2022-11-18 DIAGNOSIS — C61 Malignant neoplasm of prostate: Secondary | ICD-10-CM

## 2023-01-05 ENCOUNTER — Encounter: Payer: Self-pay | Admitting: Urology

## 2023-01-15 ENCOUNTER — Ambulatory Visit
Admission: RE | Admit: 2023-01-15 | Discharge: 2023-01-15 | Disposition: A | Discharge status: Home / Self Care | Payer: 59 | Source: Ambulatory Visit | Attending: Urology | Admitting: Urology

## 2023-01-15 DIAGNOSIS — R972 Elevated prostate specific antigen [PSA]: Secondary | ICD-10-CM | POA: Diagnosis not present

## 2023-01-15 DIAGNOSIS — C61 Malignant neoplasm of prostate: Secondary | ICD-10-CM

## 2023-01-15 MED ORDER — GADOPICLENOL 0.5 MMOL/ML IV SOLN
9.0000 mL | Freq: Once | INTRAVENOUS | Status: AC | PRN
  Administered 2023-01-15: 9 mL via INTRAVENOUS

## 2023-01-19 ENCOUNTER — Encounter (HOSPITAL_COMMUNITY): Payer: Self-pay | Admitting: Internal Medicine

## 2023-01-19 ENCOUNTER — Other Ambulatory Visit (HOSPITAL_COMMUNITY): Payer: Self-pay | Admitting: Internal Medicine

## 2023-01-19 DIAGNOSIS — N5089 Other specified disorders of the male genital organs: Secondary | ICD-10-CM

## 2023-01-19 DIAGNOSIS — C61 Malignant neoplasm of prostate: Secondary | ICD-10-CM

## 2023-01-23 ENCOUNTER — Ambulatory Visit (HOSPITAL_COMMUNITY)
Admission: RE | Admit: 2023-01-23 | Discharge: 2023-01-23 | Disposition: A | Discharge status: Home / Self Care | Payer: 59 | Source: Ambulatory Visit | Attending: Internal Medicine | Admitting: Internal Medicine

## 2023-01-23 DIAGNOSIS — N433 Hydrocele, unspecified: Secondary | ICD-10-CM | POA: Diagnosis not present

## 2023-01-23 DIAGNOSIS — C61 Malignant neoplasm of prostate: Secondary | ICD-10-CM | POA: Diagnosis not present

## 2023-01-23 DIAGNOSIS — N5089 Other specified disorders of the male genital organs: Secondary | ICD-10-CM | POA: Diagnosis not present

## 2023-05-01 DIAGNOSIS — N1831 Chronic kidney disease, stage 3a: Secondary | ICD-10-CM | POA: Diagnosis not present

## 2023-05-01 DIAGNOSIS — M810 Age-related osteoporosis without current pathological fracture: Secondary | ICD-10-CM | POA: Diagnosis not present

## 2023-05-01 DIAGNOSIS — E785 Hyperlipidemia, unspecified: Secondary | ICD-10-CM | POA: Diagnosis not present

## 2023-05-01 DIAGNOSIS — I129 Hypertensive chronic kidney disease with stage 1 through stage 4 chronic kidney disease, or unspecified chronic kidney disease: Secondary | ICD-10-CM | POA: Diagnosis not present

## 2023-05-01 DIAGNOSIS — Z1389 Encounter for screening for other disorder: Secondary | ICD-10-CM | POA: Diagnosis not present

## 2023-05-01 DIAGNOSIS — Z1212 Encounter for screening for malignant neoplasm of rectum: Secondary | ICD-10-CM | POA: Diagnosis not present

## 2023-05-01 DIAGNOSIS — Z125 Encounter for screening for malignant neoplasm of prostate: Secondary | ICD-10-CM | POA: Diagnosis not present

## 2023-05-01 DIAGNOSIS — R739 Hyperglycemia, unspecified: Secondary | ICD-10-CM | POA: Diagnosis not present

## 2023-11-18 ENCOUNTER — Encounter (INDEPENDENT_AMBULATORY_CARE_PROVIDER_SITE_OTHER): Payer: Self-pay | Admitting: *Deleted

## 2023-12-25 ENCOUNTER — Other Ambulatory Visit: Payer: Self-pay | Admitting: Internal Medicine

## 2023-12-25 DIAGNOSIS — R972 Elevated prostate specific antigen [PSA]: Secondary | ICD-10-CM

## 2024-01-18 ENCOUNTER — Encounter: Payer: Self-pay | Admitting: Internal Medicine

## 2024-01-19 ENCOUNTER — Encounter: Payer: Self-pay | Admitting: Internal Medicine

## 2024-01-20 ENCOUNTER — Encounter: Payer: Self-pay | Admitting: Internal Medicine

## 2024-01-21 ENCOUNTER — Encounter: Payer: Self-pay | Admitting: Internal Medicine

## 2024-01-22 ENCOUNTER — Encounter: Payer: Self-pay | Admitting: Internal Medicine

## 2024-01-24 ENCOUNTER — Ambulatory Visit
Admission: RE | Admit: 2024-01-24 | Discharge: 2024-01-24 | Disposition: A | Source: Ambulatory Visit | Attending: Internal Medicine | Admitting: Internal Medicine

## 2024-01-24 DIAGNOSIS — R972 Elevated prostate specific antigen [PSA]: Secondary | ICD-10-CM

## 2024-01-24 MED ORDER — GADOPICLENOL 0.5 MMOL/ML IV SOLN
10.0000 mL | Freq: Once | INTRAVENOUS | Status: AC | PRN
  Administered 2024-01-24: 10 mL via INTRAVENOUS

## 2024-02-09 ENCOUNTER — Encounter: Payer: Self-pay | Admitting: Sports Medicine

## 2024-02-09 ENCOUNTER — Ambulatory Visit: Admitting: Sports Medicine

## 2024-02-09 ENCOUNTER — Other Ambulatory Visit: Payer: Self-pay

## 2024-02-09 DIAGNOSIS — M1A09X0 Idiopathic chronic gout, multiple sites, without tophus (tophi): Secondary | ICD-10-CM | POA: Diagnosis not present

## 2024-02-09 DIAGNOSIS — M25462 Effusion, left knee: Secondary | ICD-10-CM | POA: Diagnosis not present

## 2024-02-09 DIAGNOSIS — M25562 Pain in left knee: Secondary | ICD-10-CM | POA: Diagnosis not present

## 2024-02-09 MED ORDER — METHYLPREDNISOLONE ACETATE 40 MG/ML IJ SUSP
80.0000 mg | INTRAMUSCULAR | Status: AC | PRN
  Administered 2024-02-09: 80 mg via INTRA_ARTICULAR

## 2024-02-09 MED ORDER — LIDOCAINE HCL 1 % IJ SOLN
4.0000 mL | INTRAMUSCULAR | Status: AC | PRN
  Administered 2024-02-09: 4 mL

## 2024-02-09 MED ORDER — BUPIVACAINE HCL 0.25 % IJ SOLN
2.0000 mL | INTRAMUSCULAR | Status: AC | PRN
  Administered 2024-02-09: 2 mL via INTRA_ARTICULAR

## 2024-02-09 NOTE — Progress Notes (Signed)
 Christopher Stanton - 58 y.o. male MRN [id number]  Date of birth: 18-May-1965  Office Visit Note: Visit Date: 02/09/2024 PCP: Onita Rush, MD Referred by: Bertell Satterfield, MD  Subjective: Chief Complaint  Patient presents with   Left Knee - Pain   HPI: Christopher Stanton is a pleasant 58 y.o. male who presents today for subacute left knee pain/swelling, and history of gout.  Christopher Stanton began having left knee pain and swelling and left toe pain about 3 weeks ago.  He started around that time taking his allopurinol again.  He does believe he is taking allopurinol 100 mg daily.  He states he has a history of gout but had been making dietary changes and had been off of allopurinol medication for almost 8 years.  Given his recent flare, he has been taking colchicine 0.6 mg 2 tablets once to twice daily over the last few days.  He does state this has certainly helped the pain which is somewhat minimal at this time but he still has significant swelling on the left knee.  Currently has no pain in the left toe at this time.   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  He does wear a knee pad/cushion when he is on the knees however.  Denies any changes in diet, no increased red meat, seafood, wine, excetra.  Labs reviewed from Care Everywhere 05/05/2023: -  A1C 5.9   Comp. Metabolic Panel (14) Reviewed date:05/05/2023 05:12:20 PM Interpretation: Performing Lab: Notes/Report:  Glucose 108 70-99 mg/dL    BUN 16 3-75 mg/dL    Creatinine 8.21 9.23-8.72 mg/dL    eGFR 43 >40 fO/fpw/8.26    BUN/Creatinine Ratio 9 9-20    Sodium 139 134-144 mmol/L    Potassium 4.5 3.5-5.2 mmol/L    Chloride 103 96-106 mmol/L    Carbon Dioxide, Total 24 20-29 mmol/L    Calcium 9.7 8.7-10.2 mg/dL    Protein, Total 7.7 3.9-1.4 g/dL    Albumin 4.3 6.1-5.0 g/dL    Globulin, Total 3.4 1.5-4.5 g/dL    Bilirubin,  Total 0.5 0.0-1.2 mg/dL    Alkaline Phosphatase 99 44-121 IU/L    AST (SGOT) 20 0-40 IU/L    ALT (SGPT) 17 0-44 IU/L    - 04/2021 - URIC Acid 11.5   Pertinent ROS were reviewed with the patient and found to be negative unless otherwise specified above in HPI.   Assessment & Plan: Visit Diagnoses:  1. Acute pain of left knee   2. Effusion, left knee   3. Idiopathic chronic gout of multiple sites without tophus    Plan: Impression is subacute insidious onset of knee pain and associated swelling.  Bedside ultrasound shows a large knee effusion.  This is in the setting of a history of gout that had been controlled for many years, just recently began restarting allopurinol 100 mg over the last 3 weeks.  Was dealing with episodes of podagra as well as left knee pain and swelling which was severe in nature over the last few days although has been improving with twice daily colchicine for the last few days.  He has changed positions at work requiring more physical labor and time on the knees/transitioning.  We discussed evaluating the effusion with aspiration and fluid studies to determine whether this is gout or synovitis/irritation of the knee joint itself to help guide treatment.  We did  aspirate 68 mL of the right yellow fluid with then administration of CSI, tolerated well. I discussed with Christopher Stanton this is likely indicative of gout based on appearance.  We will still send for synovial fluid analysis to confirm this and rule out any underlying abnormality.  I will message/call him with these results.  He will continue his colchicine 0.6 mg once to twice daily as needed in the interim.   Once his fluid analysis return, I will follow-up with him and his PCP with the help guide pharmacologic treatment for his underlying gout to help prevent reoccurrence if this is so his true diagnosis.  Follow-up: Return for I will mychart message/call once labs result.   Meds & Orders: No orders of the defined types were  placed in this encounter.   Orders Placed This Encounter  Procedures   Large Joint Inj   US  Extrem Low Left Ltd   Synovial Fluid Analysis, Complete    Procedures: Large Joint Inj: L knee on 02/09/2024 6:08 PM Indications: pain, joint swelling and diagnostic evaluation Details: 18 G 1.5 in needle, superolateral approach Medications: 4 mL lidocaine  1 %; 2 mL bupivacaine 0.25 %; 80 mg methylPREDNISolone acetate 40 MG/ML Aspirate: 68 mL yellow and cloudy Outcome: tolerated well, no immediate complications  US -guided Knee Aspiration, Left: After discussion on risks/benefits/indications was provided, informed verbal consent was obtained and a timeout was performed, patient was lying supine on exam table with knee bolster pillow in place. The knee was prepped with Chloraprep and multiple alcohol swabs. Utilizing superolateral approach, approximately 4 mL of lidocaine  1% was used for local anesthesia. Then using ultrasound guidance via a transverse-axis and in-plane approach, an 18g, 1.5 needle was inserted and 68 mL of bright, yellow-colored fluid was aspirated from the knee joint. Utilizing the same portal and a sterile syringe swap, the knee joint was then injected under ultrasound guidance with 2:2 bupivicaine:depomedrol with flow of the injectate visualized going into the knee joint.  Patient tolerated procedure well without immediate complications.   Procedure, treatment alternatives, risks and benefits explained, specific risks discussed. Consent was given by the patient. Immediately prior to procedure a time out was called to verify the correct patient, procedure, equipment, support staff and site/side marked as required. Patient was prepped and draped in the usual sterile fashion.          Clinical History: No specialty comments available.  He reports that he has been smoking cigarettes. He has a 27 pack-year smoking history. He has never used smokeless tobacco. No results for input(s):  HGBA1C, LABURIC in the last 8760 hours.  Objective:    Physical Exam  Gen: Well-appearing, in no acute distress; non-toxic CV: Well-perfused. Warm.  Resp: Breathing unlabored on room air; no wheezing. Psych: Fluid speech in conversation; appropriate affect; normal thought process  Ortho Exam - Left knee: There is a moderate to large effusion palpable with mild warmth to the knee joint, no redness or cellulitis.  Range of motion is restricted in flexion from about 0-125 degrees with pain at endrange flexion.  No varus or valgus instability.  Imaging:  US  Extrem Low Left Ltd Narrative: Limited musculoskeletal ultrasound of the left knee was performed today.   Evaluation of the suprapatellar pouch shows a moderate to large knee  effusion.  There is mild hyperemia indicative of inflammation, but no soft  tissue abnormality.  Superior patella was visualized without cortical  regularity.  Overlying quadricep tendon shows proper insertion.  Impression: Moderate to large knee  effusion, likely inflammatory based on  appearance     *Three-view x-ray of the left knee from 01/20/2024 [facility]) was independently reviewed and interpreted by myself today.  There is mild medial tibiofemoral joint space narrowing with early arthritic change but certainly no advanced arthritis or arthropathy.  There is at least a moderate suprapatellar joint effusion noted on lateral film.  No acute bony abnormality otherwise noted.  Past Medical/Family/Surgical/Social History: Medications & Allergies reviewed per EMR, new medications updated. Patient Active Problem List   Diagnosis Date Noted   Severe obstructive sleep apnea-hypopnea syndrome 02/18/2021   Morbid obesity (HCC) 02/18/2021   Primary hypertension 12/18/2020   Benign paroxysmal positional vertigo due to bilateral vestibular disorder 12/18/2020   Excessive daytime sleepiness 12/18/2020   Shifting sleep-work schedule 12/18/2020   Snoring  12/18/2020   Special screening for malignant neoplasms, colon    Malignant neoplasm of prostate (HCC) 05/05/2016   Past Medical History:  Diagnosis Date   Arthritis    Colon polyp    GERD (gastroesophageal reflux disease)    Gout    History of syncope    Hypercholesteremia    Hypertension    Obesity    Osteoarthritis    Prostate cancer (HCC)    Sciatica    Family History  Problem Relation Age of Onset   Heart attack Mother    Cancer Father 57       prostate   Cancer Sister        breast   Colon cancer Neg Hx    Colon polyps Neg Hx    Past Surgical History:  Procedure Laterality Date   COLONOSCOPY N/A 12/10/2018   Procedure: COLONOSCOPY;  Surgeon: Harvey Margo CROME, MD;  [facility];  Service: Endoscopy;  Laterality: N/A;  1:00   MAXILLARY ANTROSTOMY Left 12/25/2020   Procedure: LEFT MAXILLARY ANTROSTOMY WITH TISSUE REMOVAL;  Surgeon: Karis Clunes, MD;  Location: [facility];  Service: ENT;  Laterality: Left;   PROSTATE BIOPSY     TURBINATE REDUCTION Bilateral 12/25/2020   Procedure: BILATERAL TURBINATE REDUCTION;  Surgeon: Karis Clunes, MD;  Location: [facility];  Service: ENT;  Laterality: Bilateral;   Social History   Occupational History   Not on file  Tobacco Use   Smoking status: Every Day    Current packs/day: 1.00    Average packs/day: 1 pack/day for 27.0 years (27.0 ttl pk-yrs)    Types: Cigarettes   Smokeless tobacco: Never  Substance and Sexual Activity   Alcohol use: Yes    Comment: 3 margaritas on Friday    Drug use: Yes    Types: Marijuana    Comment: last smoked 12-16-20   Sexual activity: Not on file

## 2024-02-09 NOTE — Progress Notes (Signed)
 Patient says that he has had gout in the past, and had a flare about 3 weeks ago in his left toe and left knee. He began taking Allopurinol again at that time. He says that he has still had swelling and his knee feels tight, but he does not have pain. He uses topical treatment, but otherwise has not done anything to treat his knee. He denies any popping or clicking, and denies any bruising.

## 2024-02-10 LAB — SYNOVIAL FLUID ANALYSIS, COMPLETE
Basophils, %: 0 %
Eosinophils-Synovial: 0 % (ref 0–2)
Lymphocytes-Synovial Fld: 10 % (ref 0–74)
Monocyte/Macrophage: 0 % (ref 0–69)
Neutrophil, Synovial: 90 % — ABNORMAL HIGH (ref 0–24)
Synoviocytes, %: 0 % (ref 0–15)
WBC, Synovial: 10760 {cells}/uL — ABNORMAL HIGH (ref <150)

## 2024-02-11 ENCOUNTER — Ambulatory Visit: Payer: Self-pay | Admitting: Sports Medicine

## 2024-03-28 NOTE — Progress Notes (Signed)
 Christopher Stanton                                          MRN: [id number]   03/28/2024   The [facility] Quality Team Specialist reviewed this patient medical record for the purposes of chart review for care gap closure. The following were reviewed: chart review for care gap closure-controlling blood pressure.    [facility] Quality Team
